# Patient Record
Sex: Female | Born: 1985 | Hispanic: Yes | Marital: Single | Smoking: Never smoker
Health system: Southern US, Community
[De-identification: ages and names within clinical notes are randomized; demographics above are authoritative.]

## PROBLEM LIST (undated history)

## (undated) ENCOUNTER — Inpatient Hospital Stay (HOSPITAL_COMMUNITY): Payer: Self-pay

## (undated) DIAGNOSIS — R112 Nausea with vomiting, unspecified: Secondary | ICD-10-CM

## (undated) DIAGNOSIS — K219 Gastro-esophageal reflux disease without esophagitis: Secondary | ICD-10-CM

## (undated) HISTORY — DX: Gastro-esophageal reflux disease without esophagitis: K21.9

---

## 2004-06-24 ENCOUNTER — Emergency Department (HOSPITAL_COMMUNITY): Admission: EM | Admit: 2004-06-24 | Discharge: 2004-06-25 | Payer: Self-pay | Admitting: Emergency Medicine

## 2004-06-25 ENCOUNTER — Emergency Department (HOSPITAL_COMMUNITY): Admission: EM | Admit: 2004-06-25 | Discharge: 2004-06-26 | Payer: Self-pay | Admitting: Emergency Medicine

## 2004-09-03 ENCOUNTER — Inpatient Hospital Stay (HOSPITAL_COMMUNITY): Admission: AD | Admit: 2004-09-03 | Discharge: 2004-09-03 | Payer: Self-pay | Admitting: Obstetrics & Gynecology

## 2004-09-29 ENCOUNTER — Ambulatory Visit: Payer: Self-pay | Admitting: *Deleted

## 2005-01-08 ENCOUNTER — Inpatient Hospital Stay (HOSPITAL_COMMUNITY): Admission: AD | Admit: 2005-01-08 | Discharge: 2005-01-08 | Payer: Self-pay

## 2005-02-02 ENCOUNTER — Ambulatory Visit: Payer: Self-pay | Admitting: *Deleted

## 2005-02-09 ENCOUNTER — Ambulatory Visit: Payer: Self-pay | Admitting: *Deleted

## 2005-02-23 ENCOUNTER — Ambulatory Visit: Payer: Self-pay | Admitting: *Deleted

## 2005-02-25 ENCOUNTER — Inpatient Hospital Stay (HOSPITAL_COMMUNITY): Admission: AD | Admit: 2005-02-25 | Discharge: 2005-02-27 | Payer: Self-pay | Admitting: *Deleted

## 2005-02-25 ENCOUNTER — Ambulatory Visit: Payer: Self-pay | Admitting: *Deleted

## 2007-05-22 ENCOUNTER — Inpatient Hospital Stay (HOSPITAL_COMMUNITY): Admission: AD | Admit: 2007-05-22 | Discharge: 2007-05-22 | Payer: Self-pay | Admitting: Obstetrics and Gynecology

## 2007-09-11 ENCOUNTER — Inpatient Hospital Stay (HOSPITAL_COMMUNITY): Admission: AD | Admit: 2007-09-11 | Discharge: 2007-09-11 | Payer: Self-pay | Admitting: Gynecology

## 2007-09-11 ENCOUNTER — Ambulatory Visit: Payer: Self-pay | Admitting: Physician Assistant

## 2007-09-12 ENCOUNTER — Ambulatory Visit (HOSPITAL_COMMUNITY): Admission: RE | Admit: 2007-09-12 | Discharge: 2007-09-12 | Payer: Self-pay | Admitting: Obstetrics & Gynecology

## 2007-09-20 ENCOUNTER — Ambulatory Visit: Payer: Self-pay | Admitting: *Deleted

## 2007-10-04 ENCOUNTER — Ambulatory Visit: Payer: Self-pay | Admitting: *Deleted

## 2007-10-18 ENCOUNTER — Ambulatory Visit: Payer: Self-pay | Admitting: Obstetrics & Gynecology

## 2007-11-01 ENCOUNTER — Ambulatory Visit: Payer: Self-pay | Admitting: Obstetrics & Gynecology

## 2007-11-15 ENCOUNTER — Ambulatory Visit: Payer: Self-pay | Admitting: Obstetrics & Gynecology

## 2007-11-29 ENCOUNTER — Ambulatory Visit: Payer: Self-pay | Admitting: Obstetrics & Gynecology

## 2007-12-06 ENCOUNTER — Ambulatory Visit: Payer: Self-pay | Admitting: Obstetrics & Gynecology

## 2007-12-13 ENCOUNTER — Ambulatory Visit: Payer: Self-pay | Admitting: Obstetrics & Gynecology

## 2007-12-20 ENCOUNTER — Ambulatory Visit: Payer: Self-pay | Admitting: Obstetrics & Gynecology

## 2007-12-25 ENCOUNTER — Inpatient Hospital Stay (HOSPITAL_COMMUNITY): Admission: AD | Admit: 2007-12-25 | Discharge: 2007-12-27 | Payer: Self-pay | Admitting: Obstetrics and Gynecology

## 2007-12-25 ENCOUNTER — Inpatient Hospital Stay (HOSPITAL_COMMUNITY): Admission: AD | Admit: 2007-12-25 | Discharge: 2007-12-25 | Payer: Self-pay | Admitting: Obstetrics and Gynecology

## 2007-12-25 ENCOUNTER — Ambulatory Visit: Payer: Self-pay | Admitting: Obstetrics and Gynecology

## 2007-12-27 ENCOUNTER — Encounter: Payer: Self-pay | Admitting: Obstetrics and Gynecology

## 2007-12-27 ENCOUNTER — Ambulatory Visit: Payer: Self-pay | Admitting: Vascular Surgery

## 2010-03-09 ENCOUNTER — Encounter: Admission: RE | Admit: 2010-03-09 | Discharge: 2010-03-09 | Payer: Self-pay | Admitting: Family Medicine

## 2010-09-07 ENCOUNTER — Encounter: Admission: RE | Admit: 2010-09-07 | Discharge: 2010-09-07 | Payer: Self-pay | Admitting: Family Medicine

## 2010-11-08 ENCOUNTER — Encounter: Payer: Self-pay | Admitting: *Deleted

## 2010-12-11 ENCOUNTER — Inpatient Hospital Stay (HOSPITAL_COMMUNITY): Admission: RE | Admit: 2010-12-11 | Payer: Self-pay | Source: Ambulatory Visit

## 2010-12-11 ENCOUNTER — Ambulatory Visit (HOSPITAL_COMMUNITY): Payer: Self-pay

## 2010-12-11 ENCOUNTER — Inpatient Hospital Stay (INDEPENDENT_AMBULATORY_CARE_PROVIDER_SITE_OTHER)
Admission: RE | Admit: 2010-12-11 | Discharge: 2010-12-11 | Disposition: A | Discharge status: Home / Self Care | Payer: Self-pay | Source: Ambulatory Visit

## 2010-12-11 DIAGNOSIS — N6019 Diffuse cystic mastopathy of unspecified breast: Secondary | ICD-10-CM

## 2011-01-09 ENCOUNTER — Emergency Department (HOSPITAL_COMMUNITY)
Admission: EM | Admit: 2011-01-09 | Discharge: 2011-01-09 | Disposition: A | Discharge status: Home / Self Care | Payer: Self-pay | Attending: Emergency Medicine | Admitting: Emergency Medicine

## 2011-01-09 ENCOUNTER — Emergency Department (HOSPITAL_COMMUNITY): Payer: Self-pay

## 2011-01-09 DIAGNOSIS — R110 Nausea: Secondary | ICD-10-CM | POA: Insufficient documentation

## 2011-01-09 DIAGNOSIS — R1013 Epigastric pain: Secondary | ICD-10-CM | POA: Insufficient documentation

## 2011-01-09 DIAGNOSIS — M25519 Pain in unspecified shoulder: Secondary | ICD-10-CM | POA: Insufficient documentation

## 2011-01-09 DIAGNOSIS — K8020 Calculus of gallbladder without cholecystitis without obstruction: Secondary | ICD-10-CM | POA: Insufficient documentation

## 2011-01-09 DIAGNOSIS — R945 Abnormal results of liver function studies: Secondary | ICD-10-CM | POA: Insufficient documentation

## 2011-01-09 LAB — COMPREHENSIVE METABOLIC PANEL
ALT: 70 U/L — ABNORMAL HIGH (ref 0–35)
Albumin: 3.7 g/dL (ref 3.5–5.2)
Alkaline Phosphatase: 69 U/L (ref 39–117)
BUN: 4 mg/dL — ABNORMAL LOW (ref 6–23)
CO2: 19 mEq/L (ref 19–32)
Calcium: 9 mg/dL (ref 8.4–10.5)
Chloride: 112 mEq/L (ref 96–112)
GFR calc Af Amer: >60 mL/min (ref >60)
Sodium: 141 mEq/L (ref 135–145)
Total Bilirubin: 0.8 mg/dL (ref 0.3–1.2)

## 2011-01-09 LAB — LIPASE, BLOOD: Lipase: 36 U/L (ref 11–59)

## 2011-01-09 LAB — DIFFERENTIAL
Basophils Absolute: 0 10*3/uL (ref 0.0–0.1)
Basophils Relative: 0 % (ref 0–1)
Eosinophils Absolute: 0.1 10*3/uL (ref 0.0–0.7)
Eosinophils Relative: 1 % (ref 0–5)
Lymphs Abs: 2.3 10*3/uL (ref 0.7–4.0)
Monocytes Relative: 8 % (ref 3–12)
Neutrophils Relative %: 69 % (ref 43–77)

## 2011-01-09 LAB — CBC
MCH: 29.6 pg (ref 26.0–34.0)
Platelets: 279 10*3/uL (ref 150–400)

## 2011-01-09 LAB — URINE MICROSCOPIC-ADD ON

## 2011-01-09 LAB — URINALYSIS, ROUTINE W REFLEX MICROSCOPIC
Bilirubin Urine: NEGATIVE
Nitrite: NEGATIVE
Specific Gravity, Urine: 1.014 (ref 1.005–1.030)
Urobilinogen, UA: 1 mg/dL (ref 0.0–1.0)

## 2011-01-11 LAB — URINE CULTURE: Colony Count: >=100000

## 2011-07-07 LAB — POCT URINALYSIS DIP (DEVICE)
Bilirubin Urine: NEGATIVE
Glucose, UA: NEGATIVE
Hgb urine dipstick: NEGATIVE
Ketones, ur: NEGATIVE
Nitrite: NEGATIVE
Operator id: 297281
Protein, ur: NEGATIVE
Specific Gravity, Urine: 1.02
Urobilinogen, UA: 0.2
pH: 7

## 2011-07-08 LAB — POCT URINALYSIS DIP (DEVICE)
Bilirubin Urine: NEGATIVE
Glucose, UA: NEGATIVE
Hgb urine dipstick: NEGATIVE
Ketones, ur: NEGATIVE
Nitrite: NEGATIVE
Operator id: 159681
Protein, ur: NEGATIVE
Specific Gravity, Urine: 1.01
Urobilinogen, UA: 0.2
pH: 7

## 2011-07-09 LAB — POCT URINALYSIS DIP (DEVICE)
Bilirubin Urine: NEGATIVE
Bilirubin Urine: NEGATIVE
Glucose, UA: NEGATIVE
Glucose, UA: NEGATIVE
Glucose, UA: NEGATIVE
Hgb urine dipstick: NEGATIVE
Hgb urine dipstick: NEGATIVE
Hgb urine dipstick: NEGATIVE
Ketones, ur: NEGATIVE
Ketones, ur: NEGATIVE
Ketones, ur: NEGATIVE
Nitrite: NEGATIVE
Nitrite: NEGATIVE
Nitrite: NEGATIVE
Operator id: 297281
Operator id: 148111
Operator id: 159681
Protein, ur: 30 — AB
Protein, ur: 30 — AB
Protein, ur: NEGATIVE
Specific Gravity, Urine: 1.02
Specific Gravity, Urine: 1.015
Specific Gravity, Urine: 1.02
Urobilinogen, UA: 0.2
Urobilinogen, UA: 0.2
Urobilinogen, UA: 0.2
pH: 7
pH: 6.5
pH: 7

## 2011-07-12 LAB — POCT URINALYSIS DIP (DEVICE)
Bilirubin Urine: NEGATIVE
Glucose, UA: NEGATIVE
Hgb urine dipstick: NEGATIVE
Ketones, ur: NEGATIVE
Nitrite: NEGATIVE
Operator id: 297281
Protein, ur: NEGATIVE
Specific Gravity, Urine: 1.015
Urobilinogen, UA: 0.2
pH: 6.5

## 2011-07-12 LAB — CBC
HCT: 29 — ABNORMAL LOW
Hemoglobin: 9.8 — ABNORMAL LOW
MCHC: 33.9
MCV: 79.9
Platelets: 301
RBC: 3.62 — ABNORMAL LOW
RDW: 16.3 — ABNORMAL HIGH
WBC: 11.8 — ABNORMAL HIGH

## 2011-07-12 LAB — RPR: RPR Ser Ql: NONREACTIVE

## 2011-07-23 LAB — POCT URINALYSIS DIP (DEVICE)
Bilirubin Urine: NEGATIVE
Bilirubin Urine: NEGATIVE
Glucose, UA: NEGATIVE
Glucose, UA: NEGATIVE
Hgb urine dipstick: NEGATIVE
Hgb urine dipstick: NEGATIVE
Ketones, ur: NEGATIVE
Ketones, ur: NEGATIVE
Nitrite: NEGATIVE
Nitrite: NEGATIVE
Operator id: 120861
Operator id: 297281
Protein, ur: NEGATIVE
Protein, ur: NEGATIVE
Specific Gravity, Urine: 1.015
Specific Gravity, Urine: 1.02
Urobilinogen, UA: 0.2
Urobilinogen, UA: 0.2
pH: 7
pH: 7

## 2011-07-26 LAB — POCT URINALYSIS DIP (DEVICE)
Bilirubin Urine: NEGATIVE
Glucose, UA: NEGATIVE
Hgb urine dipstick: NEGATIVE
Ketones, ur: NEGATIVE
Nitrite: NEGATIVE
Operator id: 297281
Protein, ur: NEGATIVE
Specific Gravity, Urine: 1.015
Urobilinogen, UA: 0.2
pH: 6.5

## 2011-07-27 LAB — URINALYSIS, ROUTINE W REFLEX MICROSCOPIC
Bilirubin Urine: NEGATIVE
Glucose, UA: NEGATIVE
Hgb urine dipstick: NEGATIVE
Ketones, ur: NEGATIVE
Nitrite: NEGATIVE
Protein, ur: NEGATIVE
Specific Gravity, Urine: 1.025
Urobilinogen, UA: 0.2
pH: 6

## 2011-07-27 LAB — RUBELLA SCREEN: Rubella: 127.1 — ABNORMAL HIGH

## 2011-07-27 LAB — GC/CHLAMYDIA PROBE AMP, GENITAL
Chlamydia, DNA Probe: NEGATIVE
GC Probe Amp, Genital: NEGATIVE

## 2011-07-27 LAB — SICKLE CELL SCREEN: Sickle Cell Screen: NEGATIVE

## 2011-07-27 LAB — CBC
HCT: 36.8
Hemoglobin: 12.5
MCHC: 33.9
MCV: 86.4
Platelets: 286
RBC: 4.27
RDW: 14.3
WBC: 11.8 — ABNORMAL HIGH

## 2011-07-27 LAB — RPR: RPR Ser Ql: NONREACTIVE

## 2011-07-27 LAB — DIFFERENTIAL
Basophils Absolute: 0.1
Basophils Relative: 1
Eosinophils Absolute: 0.1 — ABNORMAL LOW
Eosinophils Relative: 1
Lymphocytes Relative: 19
Lymphs Abs: 2.3
Monocytes Absolute: 0.7
Monocytes Relative: 6
Neutro Abs: 8.7 — ABNORMAL HIGH
Neutrophils Relative %: 74

## 2011-07-27 LAB — STREP B DNA PROBE: Strep Group B Ag: NEGATIVE

## 2011-07-27 LAB — WET PREP, GENITAL
Clue Cells Wet Prep HPF POC: NONE SEEN
Trich, Wet Prep: NONE SEEN
Yeast Wet Prep HPF POC: NONE SEEN

## 2011-07-27 LAB — TYPE AND SCREEN
ABO/RH(D): O POS
Antibody Screen: NEGATIVE

## 2011-07-27 LAB — HEPATITIS B SURFACE ANTIGEN: Hepatitis B Surface Ag: NEGATIVE

## 2011-08-02 LAB — URINALYSIS, ROUTINE W REFLEX MICROSCOPIC
Bilirubin Urine: NEGATIVE
Glucose, UA: NEGATIVE
Hgb urine dipstick: NEGATIVE
Ketones, ur: NEGATIVE
Nitrite: NEGATIVE
Protein, ur: NEGATIVE
Specific Gravity, Urine: 1.025
Urobilinogen, UA: 0.2
pH: 6

## 2011-08-02 LAB — CBC
HCT: 37.1
MCV: 85.4
RBC: 4.34
WBC: 9

## 2011-08-02 LAB — ABO/RH: ABO/RH(D): O POS

## 2011-08-02 LAB — URINE MICROSCOPIC-ADD ON

## 2011-08-02 LAB — POCT PREGNANCY, URINE: Operator id: 23932

## 2011-08-02 LAB — HCG, QUANTITATIVE, PREGNANCY: hCG, Beta Chain, Quant, S: 97691 — ABNORMAL HIGH

## 2011-10-20 DIAGNOSIS — R112 Nausea with vomiting, unspecified: Secondary | ICD-10-CM

## 2011-10-20 HISTORY — DX: Nausea with vomiting, unspecified: R11.2

## 2012-03-20 ENCOUNTER — Other Ambulatory Visit: Payer: Self-pay | Admitting: Family Medicine

## 2012-03-20 DIAGNOSIS — N63 Unspecified lump in unspecified breast: Secondary | ICD-10-CM

## 2012-04-03 ENCOUNTER — Inpatient Hospital Stay: Admission: RE | Admit: 2012-04-03 | Payer: Self-pay | Source: Ambulatory Visit

## 2012-04-17 ENCOUNTER — Ambulatory Visit
Admission: RE | Admit: 2012-04-17 | Discharge: 2012-04-17 | Disposition: A | Discharge status: Home / Self Care | Payer: Self-pay | Source: Ambulatory Visit | Attending: Family Medicine | Admitting: Family Medicine

## 2012-04-17 DIAGNOSIS — N63 Unspecified lump in unspecified breast: Secondary | ICD-10-CM

## 2012-05-03 ENCOUNTER — Ambulatory Visit (INDEPENDENT_AMBULATORY_CARE_PROVIDER_SITE_OTHER): Payer: Self-pay | Admitting: Family Medicine

## 2012-05-03 ENCOUNTER — Encounter: Payer: Self-pay | Admitting: Family Medicine

## 2012-05-03 VITALS — BP 124/75 | HR 88 | Ht 61.0 in | Wt 206.0 lb

## 2012-05-03 DIAGNOSIS — R1013 Epigastric pain: Secondary | ICD-10-CM | POA: Insufficient documentation

## 2012-05-03 DIAGNOSIS — R350 Frequency of micturition: Secondary | ICD-10-CM

## 2012-05-03 DIAGNOSIS — Z8719 Personal history of other diseases of the digestive system: Secondary | ICD-10-CM

## 2012-05-03 DIAGNOSIS — E669 Obesity, unspecified: Secondary | ICD-10-CM | POA: Insufficient documentation

## 2012-05-03 HISTORY — DX: Obesity, unspecified: E66.9

## 2012-05-03 HISTORY — DX: Personal history of other diseases of the digestive system: Z87.19

## 2012-05-03 LAB — POCT GLYCOSYLATED HEMOGLOBIN (HGB A1C): Hemoglobin A1C: 5.1

## 2012-05-03 MED ORDER — OMEPRAZOLE 20 MG PO CPDR: 20.0000 mg | DELAYED_RELEASE_CAPSULE | Freq: Every day | ORAL | Status: DC

## 2012-05-03 NOTE — Progress Notes (Signed)
  Subjective:    Patient ID: Rhonda Waters, female    DOB: 03-15-1986, 26 y.o.   MRN: [id number]  HPI     Pt comes today to establish care in our [facility]. Visit was conducted in [place]. She presents with two complaints today: epigastric pain an urinary frequency. 1. Epigastric pain: episodic, first noticed on 2005, then repeated in 2011 and the last one was present for 8 days last week with exacerbation from Saturday to Sunday(04/30/2012). Hx:  Pt has history of cholelithiasis diagnosed by U/S in 12/2010 but never follow up with Surgery for evaluation. She also has history of GERD for several years and recently she noticed has gotten worse to the point that she avoids eating  after 6:00pm to prevent  acid reflux at night when pt is going to sleep.  The epigastric pain it is noticed to happen with empty stomach or after eating. It happens, as I mention before, in episodes and lasts for hours to days. It is described as sharp and intensity 6/10. Pt at this time does not have abdominal pain. No nausea, vomiting or changes on BM are reported. No dark stools or appreciable blood seen. It resolves on its own. Pt denies the use of NSAID's, aspirin containing meds or  OTC medications/remedies. Pt does is a non smoker, denies use of recreational drugs, alcohol or coffee. Pain is reported to worsen with ingestion of sodas and alleviates on its own.  2. Urinary frequency: reported to be present for about two years. It happens most often at night when she has to wake up several times to void. Pt denies urinary urgency, incontinence or burning with urination. No fevers, flank pain or diagnosed UTI's during this period of time. No nausea, vomiting. No dyspareunia or masses felt in vagina, no discharge. No pelvic pain. No history of STD.   Review of Systems  Constitutional: Negative for chills, activity change, appetite change and unexpected weight change.  Rest per HPI     Objective:   Physical Exam    Constitutional: She is oriented to person, place, and time. No distress.  HENT:  Mouth/Throat: Oropharynx is clear and moist. No oropharyngeal exudate.  Eyes: Conjunctivae are normal. Pupils are equal, round, and reactive to light.  Neck: Normal range of motion. Neck supple. No thyromegaly present.  Cardiovascular: Normal rate, regular rhythm and normal heart sounds.   Pulmonary/Chest: Effort normal and breath sounds normal.  Abdominal: Soft. Bowel sounds are normal. She exhibits no mass. There is no rebound and no guarding.       Mildly tender to palpation of epigastrium. No CVA tenderness.   Genitourinary:       GYN: Declined at this visit.  Musculoskeletal: She exhibits no edema.  Neurological: She is alert and oriented to person, place, and time.       No neurologic focalization.      Assessment & Plan:

## 2012-05-03 NOTE — Assessment & Plan Note (Signed)
Hx of GERD, with symptoms described most likely gastritis. Other diagnosis in the differential peptic-duodenal ulcer, H pylori infection, malignancy. Pt is non smoker, no melena, no unintentional weight loss makes less likely malignancy.  Plan: Will treat with PPI for 4-6 weeks. Will obtain occult fecal blood X3. if no improvement, fecal blood positive or worsening condition will consider referral to GI for endoscopy and respective H.Pylory testing. Pt with financial limitations is applying for [employer].

## 2012-05-03 NOTE — Assessment & Plan Note (Signed)
Nocturia present for 2 years favors a chronic condition. No symptoms of dysuria, flank pain, fever. Vaginal exam declined in this visit. Pt obese and borderline Glucose. No polydipsia. U/S 12/2010 with no anatomic abnormalities, no masses. Pt obese the possibility of OSA is not excluded (Nocturia is seen in 50 % of OSA ) Overreactive bladder less likely since pt is not incontinent (stress or urge) and increase urinary frequency is not perceived to happen during the day. Plan: We recommended pap smear and at that point will evaluate for cystocele or other mechanical obstructions. A1C today to evaluate for possible underlining DM. Pt with financial limitations declines urinalysis, electrolytes and  kidney function testing at this visit. Will re-assess this problem next visit.  Sleep hygiene discussed.

## 2012-05-03 NOTE — Patient Instructions (Addendum)
Ha sido un placer conocerla hoy. El medicamento que le recomiendo tomar es Omeprazole 20 mg y lo debe tomar diario por 6 semanas.  Tambien es importante que se tome las muestras de heces  fecales y nos las Armed forces technical officer. Si tuviese mas dolor o cambio de Careers information officer de las deposiciones necesita ser evaluada nuevamente. Se le va a hacer un analisis hoy para ver si tiene problemas con Neurosurgeon. Si el analisis da alterado yo la llamo personalmente, sino pues lo conversamos en la visita siguiente. Haga una cita en 4-6 semanas para saber como sigue. Haga tambien una cita para la prueba del Fate.

## 2012-06-14 ENCOUNTER — Encounter: Payer: Self-pay | Admitting: Family Medicine

## 2012-06-14 ENCOUNTER — Ambulatory Visit (INDEPENDENT_AMBULATORY_CARE_PROVIDER_SITE_OTHER): Payer: Self-pay | Admitting: Family Medicine

## 2012-06-14 VITALS — BP 112/70 | HR 67 | Temp 97.9°F | Resp 20 | Wt 199.0 lb

## 2012-06-14 DIAGNOSIS — Z8719 Personal history of other diseases of the digestive system: Secondary | ICD-10-CM

## 2012-06-14 DIAGNOSIS — E669 Obesity, unspecified: Secondary | ICD-10-CM

## 2012-06-14 DIAGNOSIS — R350 Frequency of micturition: Secondary | ICD-10-CM

## 2012-06-14 DIAGNOSIS — K089 Disorder of teeth and supporting structures, unspecified: Secondary | ICD-10-CM

## 2012-06-14 DIAGNOSIS — G8929 Other chronic pain: Secondary | ICD-10-CM

## 2012-06-14 DIAGNOSIS — K219 Gastro-esophageal reflux disease without esophagitis: Secondary | ICD-10-CM

## 2012-06-14 MED ORDER — OMEPRAZOLE 20 MG PO CPDR: 20.0000 mg | DELAYED_RELEASE_CAPSULE | Freq: Every day | ORAL | Status: DC

## 2012-06-14 NOTE — Assessment & Plan Note (Addendum)
BMI improving. Intentional weight loss of 8 lb in 5 weeks. Very reassuring. Pt very motivated. A1C 5.1 Plan: Positive reinforcement during the visit. Continue diet, exercise 30 min 3 times a weeks also recommended.

## 2012-06-14 NOTE — Assessment & Plan Note (Addendum)
Still with symptoms but pt has not been compliant with treatment indicated. Plan: Omeprazole sent to pharmacy. Since pt states that her brother lost her paper prescription that was given at last visit. Fecal occult testing important to evaluate complications and/or need for scoping.

## 2012-06-14 NOTE — Patient Instructions (Addendum)
Reflujo gastroesofgico - Adultos  (Gastroesophageal Reflux Disease, Adult)  El reflujo gastroesofgico ocurre cuando el cido del estmago pasa al esfago. Cuando el cido entra en contacto con el esfago, el cido provoca dolor (inflamacin) en el esfago. Con el tiempo, pueden formarse pequeos agujeros (lceras) en el revestimiento del esfago. CAUSAS   Exceso de peso corporal. Esto aplica presin sobre el estmago, lo que hace que el cido del estmago suba hacia el esfago.   El hbito de fumar Aumenta la produccin de cido en el estmago.   El consumo de alcohol. Provoca disminucin de la presin en el esfnter esofgico inferior (vlvula o anillo de msculo entre el esfago y el estmago), permitiendo que el cido del estmago suba hacia el esfago.   Cenas a ltima hora del da y estmago lleno. Aumenta la presin y la produccin de cido en el estmago.   Malformacin en el esfnter esofgico inferior.  A menudo no se halla causa.  SNTOMAS   Ardor y dolor en la parte inferior del pecho detrs del esternn y en la zona media del estmago. Puede ocurrir dos veces por semana o ms a menudo.   Dificultad para tragar.   Dolor de garganta.   Tos seca.   Sntomas similares al asma que incluyen sensacin de opresin en el pecho, falta de aire y sibilancias.  DIAGNSTICO  El mdico diagnosticar el problema basndose en los sntomas. En algunos casos, se indican radiografas y otras pruebas para verificar si hay complicaciones o para comprobar el estado del estmago y el esfago.  TRATAMIENTO  El mdico le indicar medicamentos de venta libre o recetados para ayudar a disminuir la produccin de cido. Consulte con su mdico antes de empezar o agregar cualquier medicamento nuevo.  INSTRUCCIONES PARA EL CUIDADO EN EL HOGAR   Modifique los factores que pueda cambiar. Consulte con su mdico para solicitar orientacin relacionada con la prdida de peso, dejar de fumar y el consumo de  alcohol.   Evite las comidas y bebidas que empeoran los problemas, como:   Bebidas con cafena o alcohlicas.   Chocolate.   Sabores a menta.   Ajo y cebolla.   Comidas muy condimentadas.   Ctricos como naranjas, limones o limas.   Alimentos que contengan tomate, como salsas, chile y pizza.   Alimentos fritos y grasos.   Evite acostarse durante 3 horas antes de irse a dormir o antes de tomar una siesta.   Haga comidas pequeas durante el da en lugar de 3 comidas abundantes.   Use ropas sueltas. No use nada apretado alrededor de la cintura que cause presin en el estmago.   Levante (eleve) la cabecera de la cama 6 a 8 pulgadas (15 a 20 cm) con bloques de madera. Usar almohadas extra no ayuda.   Solo tome medicamentos que se pueden comprar sin receta o recetados para el dolor, malestar o fiebre, como le indica el mdico.   No tome aspirina, ibuprofeno ni antiinflamatorios no esteroides.  SOLICITE ATENCIN MDICA DE INMEDIATO SI:   Siente dolor en los brazos, el cuello, la mandbula, los dientes o la espalda.   El dolor aumenta o cambia la intensidad o la durancin.   Tiene nuseas, vmitos o sudoracin(diaforesis).   Siente falta de aire o dolor en el pecho, o se desmaya.   Vomita y el vmito tiene sangre, es de color verde, amarillo, negro o es similar a la borra del caf o tiene sangre.   Las heces son rojas, sanguinolentas o   negras.  Estos sntomas pueden ser signos de otros problemas, como enfermedades cardacas, hemorragias gstrias o sangrado esofgico.  ASEGRESE DE QUE:   Comprende estas instrucciones.   Controlar su enfermedad.   Solicitar ayuda de inmediato si no mejora o si empeora.  Document Released: 07/14/2005 Document Revised: 09/23/2011 [employer] Patient Information 2012 [employer]. 

## 2012-06-14 NOTE — Progress Notes (Signed)
  Subjective:    Patient ID: Rhonda Waters, female    DOB: [birth year], 26 y.o.   MRN: [id number]  HPI Pt comes today for followup 1. GERD: She never took the medication prescribed. She still has symptoms of acid reflux that worsen with certain foods. She also did not obtain the fecal ocult test ordered. Denies others symptoms like dark stools, abdominal pain or changes on her BM habits. 2. Urinary problems: Denies symptoms of dysuria or frequency at this time. 3. Obesity: interested in losing weight. Concerned about this condition as a risk factor of other comorbidities. Has loss weight intentionally with diet modification. She keeps herself active but no on a exercise regimen per se. 4. Dental pain: Dental pain is mostly on her upper right second molar and intermittent. She also reports pain on one of the frontal tooth. No current symptoms of acute infection. Denies pain today, edema, drainage or adenopathies. No fever. She would like a referral to odontology in order of take care of this electively.  Health Maintenance: Declines pap smear today. Review of Systems Per HPI    Objective:   Physical Exam Gen:  NAD HEENT: Moist mucous membranes  CV: Regular rate and rhythm, no murmurs PULM: Clear to auscultation bilaterally.  ABD: Soft, non tender, normal bowel sounds. Not CVA tenderness EXT: No edema Neuro: Alert and oriented x3. No focalization     Assessment & Plan:

## 2012-10-19 ENCOUNTER — Encounter (HOSPITAL_COMMUNITY): Payer: Self-pay

## 2012-10-19 ENCOUNTER — Observation Stay (HOSPITAL_COMMUNITY)
Admission: EM | Admit: 2012-10-19 | Discharge: 2012-10-21 | Disposition: A | Discharge status: Home / Self Care | Payer: Medicaid Other | Attending: Surgery | Admitting: Surgery

## 2012-10-19 ENCOUNTER — Emergency Department (HOSPITAL_COMMUNITY): Payer: Medicaid Other

## 2012-10-19 DIAGNOSIS — K8020 Calculus of gallbladder without cholecystitis without obstruction: Secondary | ICD-10-CM

## 2012-10-19 DIAGNOSIS — K8010 Calculus of gallbladder with chronic cholecystitis without obstruction: Principal | ICD-10-CM | POA: Insufficient documentation

## 2012-10-19 DIAGNOSIS — R109 Unspecified abdominal pain: Secondary | ICD-10-CM

## 2012-10-19 DIAGNOSIS — K219 Gastro-esophageal reflux disease without esophagitis: Secondary | ICD-10-CM | POA: Insufficient documentation

## 2012-10-19 HISTORY — DX: Nausea with vomiting, unspecified: R11.2

## 2012-10-19 LAB — URINE MICROSCOPIC-ADD ON

## 2012-10-19 LAB — URINALYSIS, ROUTINE W REFLEX MICROSCOPIC
Glucose, UA: NEGATIVE mg/dL
Hgb urine dipstick: NEGATIVE
Ketones, ur: NEGATIVE mg/dL
Nitrite: NEGATIVE
Protein, ur: NEGATIVE mg/dL
Urobilinogen, UA: 0.2 mg/dL (ref 0.0–1.0)

## 2012-10-19 LAB — CBC WITH DIFFERENTIAL/PLATELET
Basophils Relative: 0 % (ref 0–1)
Eosinophils Absolute: 0.1 10*3/uL (ref 0.0–0.7)
Eosinophils Relative: 0 % (ref 0–5)
HCT: 34.8 % — ABNORMAL LOW (ref 36.0–46.0)
Hemoglobin: 11.6 g/dL — ABNORMAL LOW (ref 12.0–15.0)
Lymphocytes Relative: 17 % (ref 12–46)
Lymphs Abs: 2.6 10*3/uL (ref 0.7–4.0)
MCH: 28.7 pg (ref 26.0–34.0)
MCV: 86.1 fL (ref 78.0–100.0)
Monocytes Absolute: 0.5 10*3/uL (ref 0.1–1.0)
Neutro Abs: 11.5 10*3/uL — ABNORMAL HIGH (ref 1.7–7.7)
RDW: 13.4 % (ref 11.5–15.5)

## 2012-10-19 LAB — COMPREHENSIVE METABOLIC PANEL
AST: 13 U/L (ref 0–37)
Chloride: 103 mEq/L (ref 96–112)
GFR calc Af Amer: >90 mL/min (ref >90)
Potassium: 3.8 mEq/L (ref 3.5–5.1)
Total Protein: 7.5 g/dL (ref 6.0–8.3)

## 2012-10-19 LAB — LIPASE, BLOOD: Lipase: 14 U/L (ref 11–59)

## 2012-10-19 MED ORDER — HYDROMORPHONE HCL PF 1 MG/ML IJ SOLN
1.0000 mg | Freq: Once | INTRAMUSCULAR | Status: AC
  Administered 2012-10-19: 1 mg via INTRAVENOUS
  Filled 2012-10-19: qty 1

## 2012-10-19 MED ORDER — INFLUENZA VIRUS VACC SPLIT PF IM SUSP: 0.5000 mL | INTRAMUSCULAR | Status: AC

## 2012-10-19 MED ORDER — DOCUSATE SODIUM 100 MG PO CAPS
100.0000 mg | ORAL_CAPSULE | Freq: Two times a day (BID) | ORAL | Status: DC
  Administered 2012-10-19: 100 mg via ORAL

## 2012-10-19 MED ORDER — POLYETHYLENE GLYCOL 3350 17 G PO PACK
17.0000 g | PACK | Freq: Every day | ORAL | Status: DC | PRN
  Filled 2012-10-19: qty 1

## 2012-10-19 MED ORDER — ONDANSETRON HCL 4 MG/2ML IJ SOLN
4.0000 mg | Freq: Once | INTRAMUSCULAR | Status: AC
  Administered 2012-10-19: 4 mg via INTRAVENOUS
  Filled 2012-10-19: qty 2

## 2012-10-19 MED ORDER — ONDANSETRON 4 MG PO TBDP
8.0000 mg | ORAL_TABLET | Freq: Once | ORAL | Status: AC
  Administered 2012-10-19: 8 mg via ORAL
  Filled 2012-10-19 (×2): qty 1

## 2012-10-19 MED ORDER — ONDANSETRON HCL 4 MG/2ML IJ SOLN: 4.0000 mg | Freq: Four times a day (QID) | INTRAMUSCULAR | Status: DC | PRN

## 2012-10-19 MED ORDER — POTASSIUM CHLORIDE IN NACL 20-0.9 MEQ/L-% IV SOLN
INTRAVENOUS | Status: DC
  Administered 2012-10-19 – 2012-10-20 (×2): via INTRAVENOUS
  Filled 2012-10-19 (×4): qty 1000

## 2012-10-19 MED ORDER — HYDROMORPHONE HCL PF 1 MG/ML IJ SOLN
0.5000 mg | Freq: Once | INTRAMUSCULAR | Status: AC
  Administered 2012-10-19: 0.5 mg via INTRAVENOUS

## 2012-10-19 MED ORDER — DIPHENHYDRAMINE HCL 50 MG/ML IJ SOLN: 12.5000 mg | Freq: Four times a day (QID) | INTRAMUSCULAR | Status: DC | PRN

## 2012-10-19 MED ORDER — GI COCKTAIL ~~LOC~~
30.0000 mL | Freq: Once | ORAL | Status: AC
  Administered 2012-10-19: 30 mL via ORAL
  Filled 2012-10-19: qty 30

## 2012-10-19 MED ORDER — DIPHENHYDRAMINE HCL 12.5 MG/5ML PO ELIX
12.5000 mg | ORAL_SOLUTION | Freq: Four times a day (QID) | ORAL | Status: DC | PRN
  Filled 2012-10-19: qty 10

## 2012-10-19 MED ORDER — INFLUENZA VIRUS VACC SPLIT PF IM SUSP: 0.5000 mL | INTRAMUSCULAR | Status: DC

## 2012-10-19 MED ORDER — SODIUM CHLORIDE 0.9 % IV SOLN: INTRAVENOUS | Status: DC

## 2012-10-19 MED ORDER — MORPHINE SULFATE 2 MG/ML IJ SOLN
1.0000 mg | INTRAMUSCULAR | Status: DC | PRN
  Administered 2012-10-19: 2 mg via INTRAVENOUS
  Administered 2012-10-19: 4 mg via INTRAVENOUS
  Administered 2012-10-19 (×2): 2 mg via INTRAVENOUS
  Administered 2012-10-20: 3 mg via INTRAVENOUS
  Filled 2012-10-19: qty 2
  Filled 2012-10-19: qty 1
  Filled 2012-10-19: qty 2
  Filled 2012-10-19 (×2): qty 1

## 2012-10-19 MED ORDER — SODIUM CHLORIDE 0.9 % IV BOLUS (SEPSIS)
500.0000 mL | Freq: Once | INTRAVENOUS | Status: AC
  Administered 2012-10-19: 1000 mL via INTRAVENOUS

## 2012-10-19 MED ORDER — HYDROCODONE-ACETAMINOPHEN 5-325 MG PO TABS: 1.0000 | ORAL_TABLET | ORAL | Status: DC | PRN

## 2012-10-19 NOTE — ED Notes (Signed)
Pt presents with onset of epigastric pain that woke her from sleep at 0300 this morning.  +nausea and vomiting, pt denies any diarrhea.

## 2012-10-19 NOTE — H&P (Signed)
Rhonda Waters is an 27 y.o. female.   Chief Complaint: RUQ and epigastric pain HPI:  27 y/o female presents to [facility] with severe onset of colicky epigastric/RUQ pain that woke her from sleep at 0300 this morning. She thought it was heartburn, but it worsened in severity which bought her to the hospital.  She also c/o nausea and vomiting, but denies diarrhea, fevers/chills. Pt has had 3 similar episodes in the past in 2005, 2006, and 2012.  She was told that she had a "large ball in her intestines blocking it".  She has had 3 children most recent in 2009.     Past Medical History  Diagnosis Date  . GERD (gastroesophageal reflux disease)     History reviewed. No pertinent past surgical history.  No family history on file. Social History:  reports that she has never smoked. She has never used smokeless tobacco. She reports that she does not drink alcohol or use illicit drugs.  Allergies: No Known Allergies   (Not in a hospital admission)  Results for orders placed during the hospital encounter of 10/19/12 (from the past 48 hour(s))  URINALYSIS, ROUTINE W REFLEX MICROSCOPIC     Status: Abnormal   Collection Time   10/19/12 11:31 AM      Component Value Range Comment   Color, Urine YELLOW  YELLOW    APPearance CLOUDY (*) CLEAR    Specific Gravity, Urine 1.037 (*) 1.005 - 1.030    pH 8.0  5.0 - 8.0    Glucose, UA NEGATIVE  NEGATIVE mg/dL    Hgb urine dipstick NEGATIVE  NEGATIVE    Bilirubin Urine NEGATIVE  NEGATIVE    Ketones, ur NEGATIVE  NEGATIVE mg/dL    Protein, ur NEGATIVE  NEGATIVE mg/dL    Urobilinogen, UA 0.2  0.0 - 1.0 mg/dL    Nitrite NEGATIVE  NEGATIVE    Leukocytes, UA TRACE (*) NEGATIVE   PREGNANCY, URINE     Status: Normal   Collection Time   10/19/12 11:31 AM      Component Value Range Comment   Preg Test, Ur NEGATIVE  NEGATIVE   URINE MICROSCOPIC-ADD ON     Status: Abnormal   Collection Time   10/19/12 11:31 AM      Component Value Range Comment   Squamous  Epithelial / LPF MANY (*) RARE    WBC, UA 0-2  <3 WBC/hpf    RBC / HPF 0-2  <3 RBC/hpf    Bacteria, UA FEW (*) RARE    Urine-Other MUCOUS PRESENT     CBC WITH DIFFERENTIAL     Status: Abnormal   Collection Time   10/19/12 11:47 AM      Component Value Range Comment   WBC 14.7 (*) 4.0 - 10.5 K/uL    RBC 4.04  3.87 - 5.11 MIL/uL    Hemoglobin 11.6 (*) 12.0 - 15.0 g/dL    HCT 16.1 (*) 09.6 - 46.0 %    MCV 86.1  78.0 - 100.0 fL    MCH 28.7  26.0 - 34.0 pg    MCHC 33.3  30.0 - 36.0 g/dL    RDW 04.5  40.9 - 81.1 %    Platelets 301  150 - 400 K/uL    Neutrophils Relative 79 (*) 43 - 77 %    Neutro Abs 11.5 (*) 1.7 - 7.7 K/uL    Lymphocytes Relative 17  12 - 46 %    Lymphs Abs 2.6  0.7 - 4.0 K/uL  Monocytes Relative 4  3 - 12 %    Monocytes Absolute 0.5  0.1 - 1.0 K/uL    Eosinophils Relative 0  0 - 5 %    Eosinophils Absolute 0.1  0.0 - 0.7 K/uL    Basophils Relative 0  0 - 1 %    Basophils Absolute 0.0  0.0 - 0.1 K/uL   COMPREHENSIVE METABOLIC PANEL     Status: Abnormal   Collection Time   10/19/12 11:47 AM      Component Value Range Comment   Sodium 137  135 - 145 mEq/L    Potassium 3.8  3.5 - 5.1 mEq/L    Chloride 103  96 - 112 mEq/L    CO2 23  19 - 32 mEq/L    Glucose, Bld 97  70 - 99 mg/dL    BUN 11  6 - 23 mg/dL    Creatinine, Ser 4.54  0.50 - 1.10 mg/dL    Calcium 9.3  8.4 - 09.8 mg/dL    Total Protein 7.5  6.0 - 8.3 g/dL    Albumin 3.5  3.5 - 5.2 g/dL    AST 13  0 - 37 U/L    ALT 14  0 - 35 U/L    Alkaline Phosphatase 63  39 - 117 U/L    Total Bilirubin 0.2 (*) 0.3 - 1.2 mg/dL    GFR calc non Af Amer >90  >90 mL/min    GFR calc Af Amer >90  >90 mL/min   LIPASE, BLOOD     Status: Normal   Collection Time   10/19/12 11:47 AM      Component Value Range Comment   Lipase 14  11 - 59 U/L    US Abdomen Complete  10/19/2012  *RADIOLOGY REPORT*  Clinical Data:  Abdominal pain  COMPLETE ABDOMINAL ULTRASOUND  Comparison:  None.  Findings:  Gallbladder:  Multiple gallstones.   No gallbladder wall thickening or pericholecystic fluid.  Negative sonographic Murphy's sign.  Common bile duct:  Measures 4 mm.  Liver:  No focal lesion identified.  Within normal limits in parenchymal echogenicity.  IVC:  Appears normal.  Pancreas:  Incompletely visualized but grossly unremarkable.  Spleen:  Measures 6.1 cm.  Right Kidney:  Measures 11.7 cm.  No mass or hydronephrosis.  Left Kidney:  Measures 12.1 cm.  No mass or hydronephrosis.  Abdominal aorta:  No aneurysm identified.  IMPRESSION: Cholelithiasis, without associated findings to suggest acute cholecystitis.   Original Report Authenticated By: Charline Bills, M.D.     Review of Systems  Constitutional: Negative for fever and chills.  Respiratory: Negative for shortness of breath.   Cardiovascular: Negative for chest pain.  Gastrointestinal: Positive for heartburn, nausea, vomiting and abdominal pain (RUQ and epigastric). Negative for constipation.  Genitourinary: Negative for dysuria.  Musculoskeletal: Negative for myalgias.  Neurological: Positive for weakness.    Blood pressure 96/52, pulse 70, temperature 98.2 F (36.8 C), temperature source Oral, resp. rate 16, last menstrual period 09/28/2012, SpO2 98.00%. Physical Exam  Constitutional: She is oriented to person, place, and time. She appears well-developed and well-nourished.  HENT:  Head: Normocephalic and atraumatic.  Eyes: Conjunctivae normal are normal. Pupils are equal, round, and reactive to light. Right eye exhibits no discharge.  Neck: Normal range of motion.  Cardiovascular: Normal rate and regular rhythm.  Exam reveals no gallop and no friction rub.   No murmur heard. Respiratory: Effort normal and breath sounds normal. No respiratory distress. She has no  wheezes. She has no rales. She exhibits no tenderness.  GI: Soft. Bowel sounds are normal. She exhibits no distension and no mass. There is no hepatosplenomegaly. There is tenderness (RUQ and epigastric)  in the right upper quadrant and epigastric area. There is no rebound and no guarding.  Neurological: She is alert and oriented to person, place, and time.  Skin: Skin is warm and dry.  Psychiatric: She has a normal mood and affect. Her behavior is normal.     Assessment/Plan 27 y/o female with recurrent cholelithiasis and suspected cholecystitis 1.  Admit to [facility], clears now, NPO after midnight, IVF, pain control, antiemetics 2.  Schedule for surgery tomorrow for anticipated Lap chole with IOC 3.  Explained procedure to patient using the translator line.  Pt is agreeable to surgery. 4.  Ambulate as tolerated  DORT, Julyanna Scholle 10/19/2012, 2:45 PM

## 2012-10-19 NOTE — H&P (Signed)
General Surgery [facility]  Patient seen and examined in the ER.  Patient's husband and children at bedside, and they speak English and were able to translate for the patient.  Symptomatic cholelithiasis with at least four previous episodes.  Pain now unrelenting with WBC 14K.  Plan to admit and proceed to lap chole with IOC in the morning.  The risks and benefits of the procedure have been discussed at length with the patient.  The patient understands the proposed procedure, potential alternative treatments, and the course of recovery to be expected.  All of the patient's questions have been answered at this time.  The patient wishes to proceed with surgery.  Velora Heckler, MD, [facility] Office: [phone]

## 2012-10-20 ENCOUNTER — Inpatient Hospital Stay (HOSPITAL_COMMUNITY): Payer: Medicaid Other | Admitting: Certified Registered"

## 2012-10-20 ENCOUNTER — Encounter (HOSPITAL_COMMUNITY): Admission: EM | Disposition: A | Discharge status: Home / Self Care | Payer: Self-pay | Source: Home / Self Care

## 2012-10-20 ENCOUNTER — Encounter (HOSPITAL_COMMUNITY): Payer: Self-pay | Admitting: Certified Registered"

## 2012-10-20 ENCOUNTER — Inpatient Hospital Stay (HOSPITAL_COMMUNITY): Payer: Medicaid Other

## 2012-10-20 DIAGNOSIS — K8010 Calculus of gallbladder with chronic cholecystitis without obstruction: Secondary | ICD-10-CM | POA: Diagnosis present

## 2012-10-20 HISTORY — PX: CHOLECYSTECTOMY: SHX55

## 2012-10-20 LAB — COMPREHENSIVE METABOLIC PANEL
ALT: 11 U/L (ref 0–35)
AST: 11 U/L (ref 0–37)
Alkaline Phosphatase: 55 U/L (ref 39–117)
CO2: 24 mEq/L (ref 19–32)
Chloride: 103 mEq/L (ref 96–112)
GFR calc non Af Amer: >90 mL/min (ref >90)
Potassium: 3.7 mEq/L (ref 3.5–5.1)
Sodium: 138 mEq/L (ref 135–145)
Total Bilirubin: 0.4 mg/dL (ref 0.3–1.2)

## 2012-10-20 LAB — CBC
MCH: 29 pg (ref 26.0–34.0)
MCHC: 33.8 g/dL (ref 30.0–36.0)
MCV: 86 fL (ref 78.0–100.0)
Platelets: 257 10*3/uL (ref 150–400)

## 2012-10-20 LAB — URINE CULTURE: Colony Count: 15000

## 2012-10-20 LAB — SURGICAL PCR SCREEN: MRSA, PCR: NEGATIVE

## 2012-10-20 IMAGING — RF DG CHOLANGIOGRAM OPERATIVE
1 series · 5 of 5 positions shown · non-contrast
Comparison: Ultrasound dated [DATE]

CLINICAL DATA: Cholecystectomy for cholelithiasis.

INTRAOPERATIVE CHOLANGIOGRAM
TECHNIQUE: Cholangiographic images from the C-arm fluoroscopic
device were submitted for interpretation post-operatively.  Please
see the procedural report for the amount of contrast and the
fluoroscopy time utilized.

[Series 1: run · 2 acquisitions, 5 frames shown]
[im 1/2]
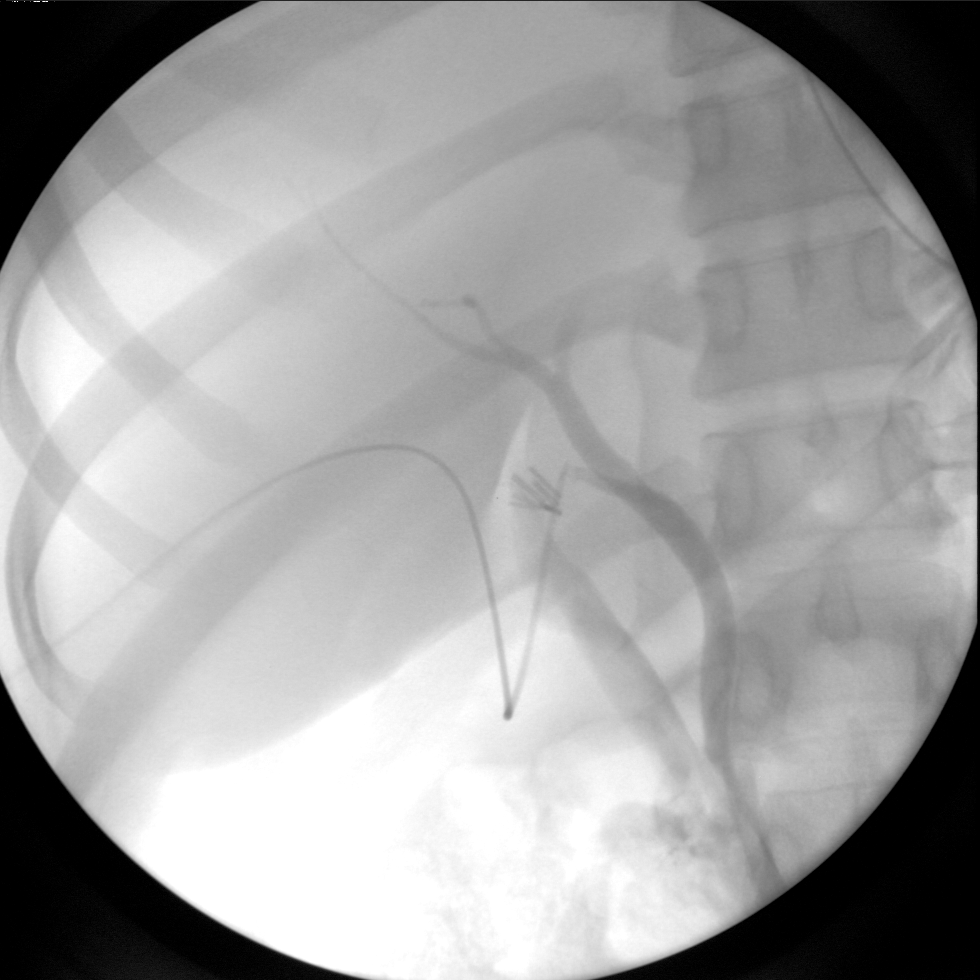
[im 1/2]
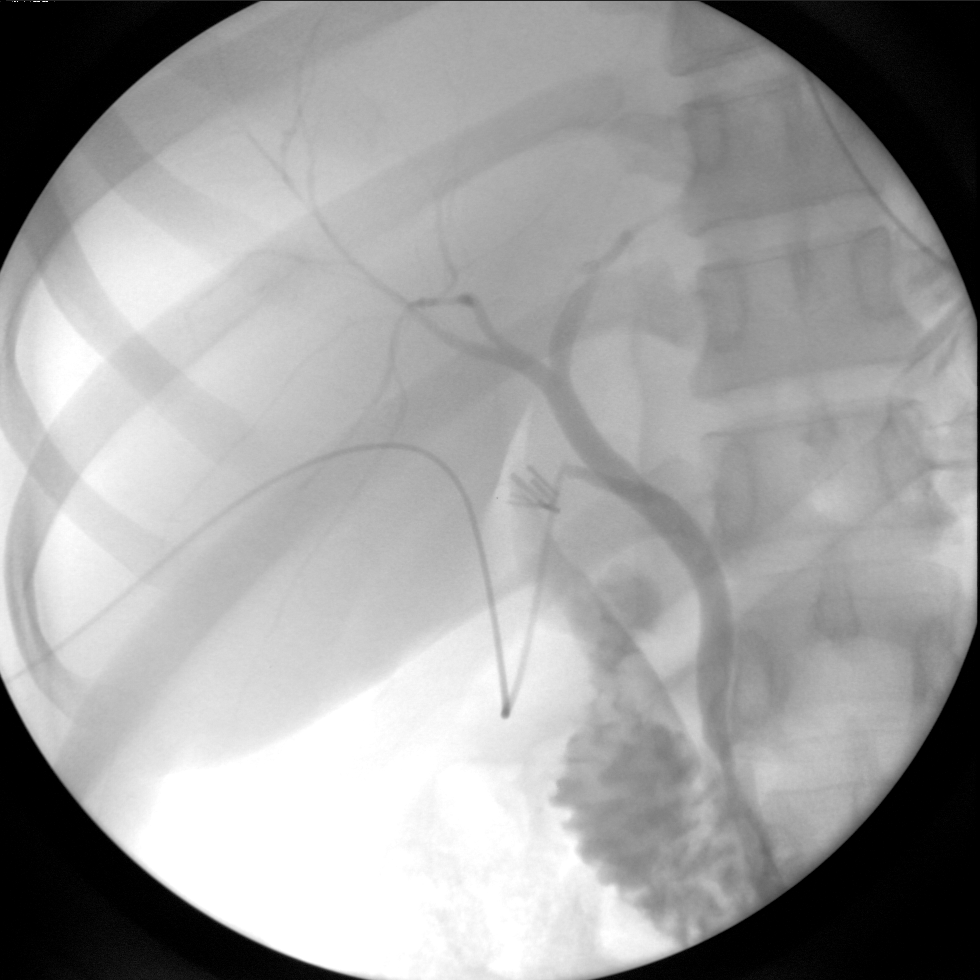
[im 1/2]
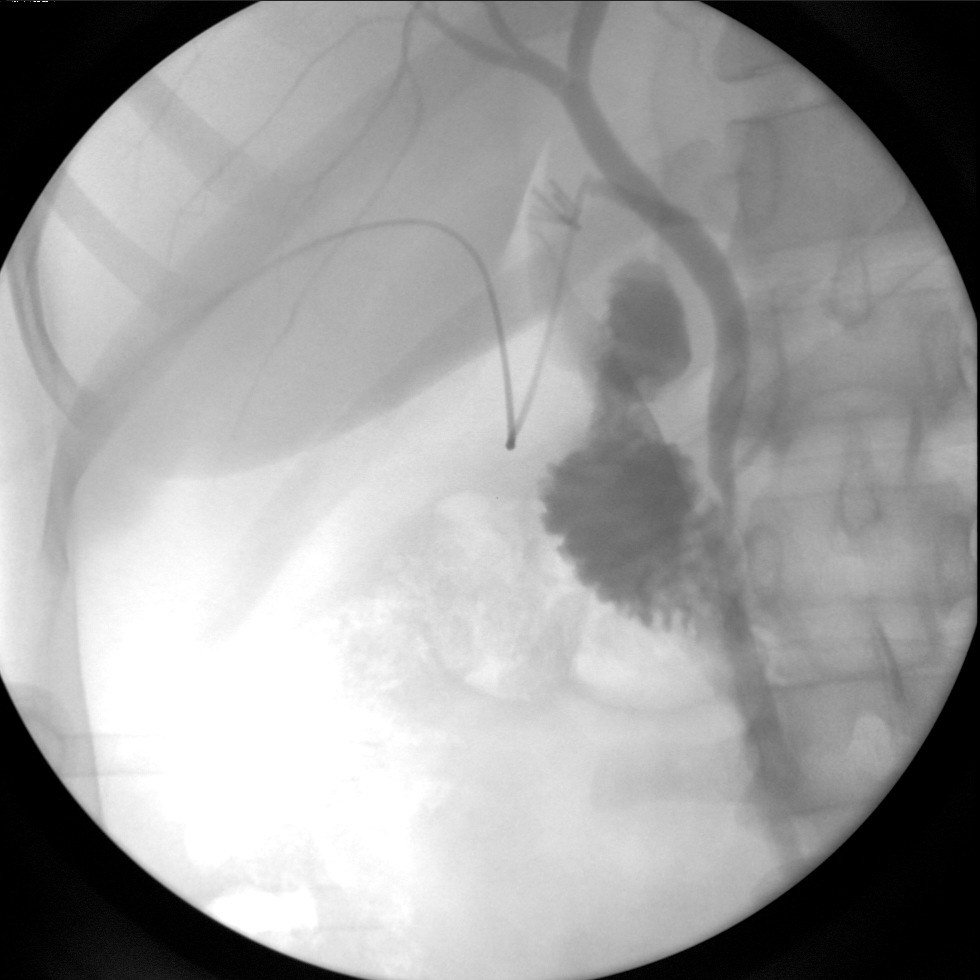
[im 1/2]
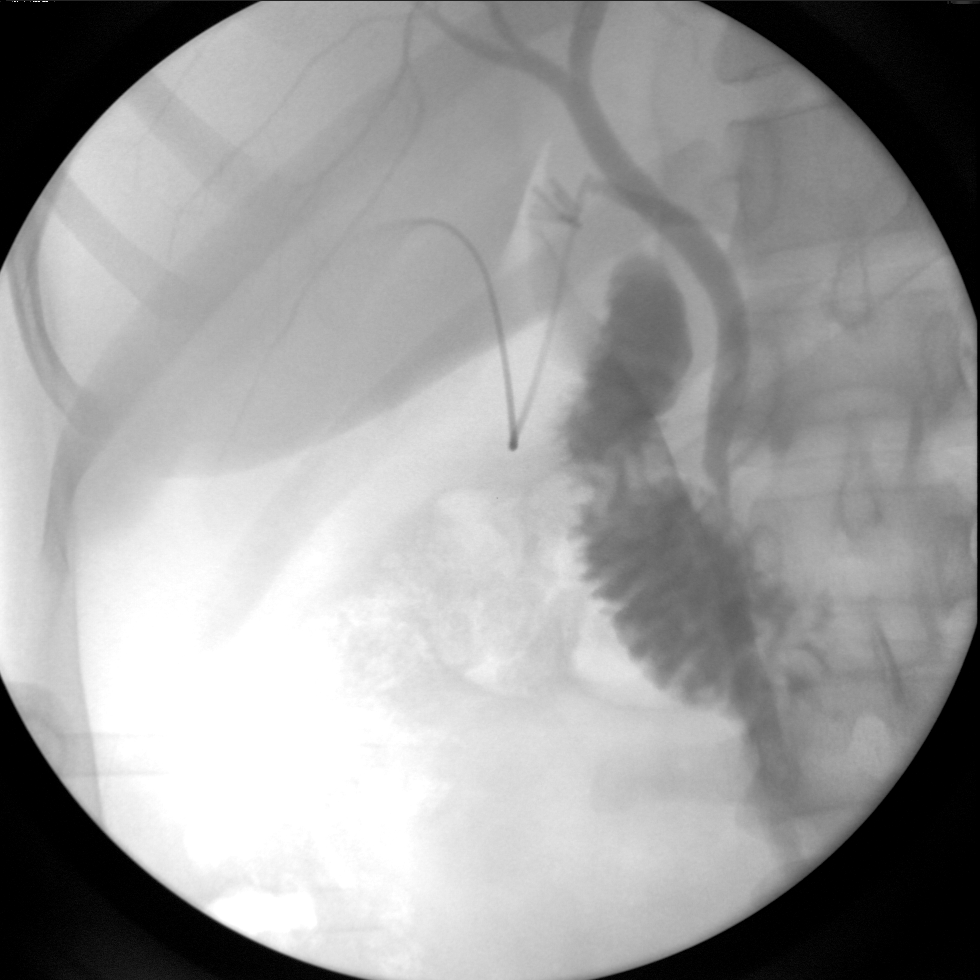
[im 2/2]
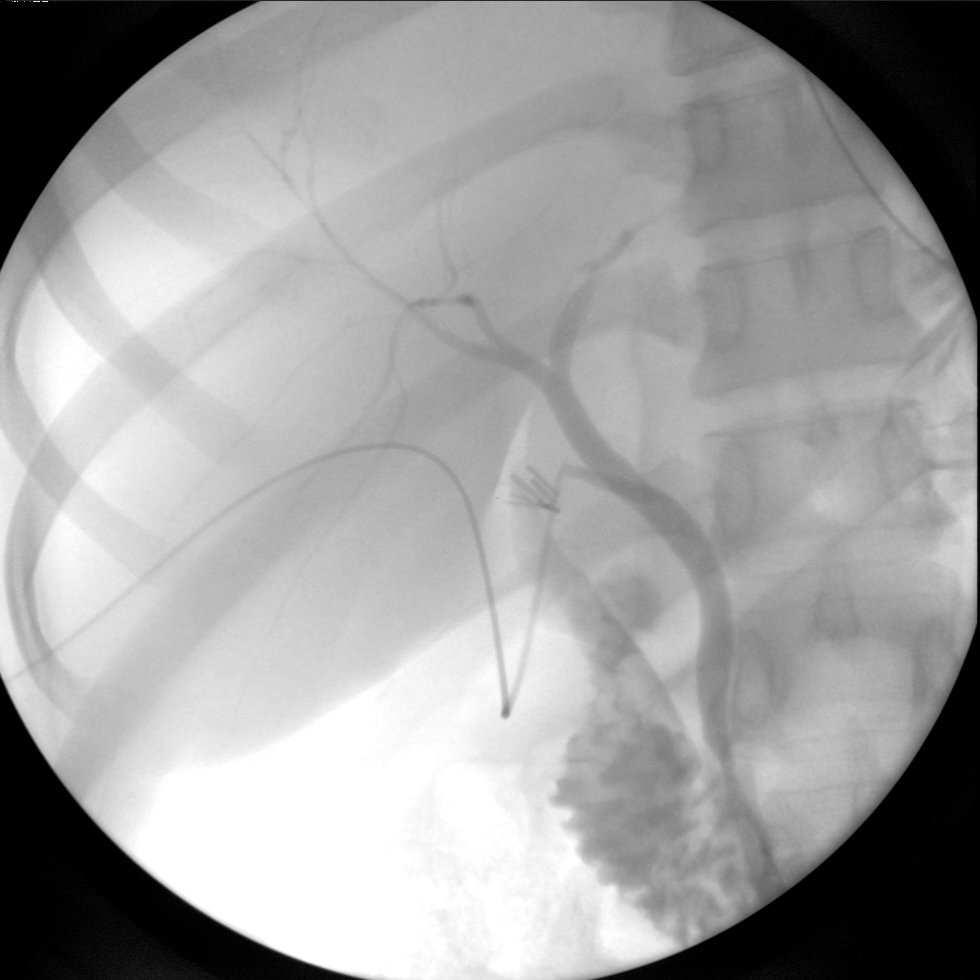

[5 of 5 positions shown; findings below may reference images not displayed]

FINDINGS: Cholangiogram obtained intraoperatively shows normally
patent and opacified bile ducts with contrast entering the
duodenum.  No filling defects or extravasation identified.
IMPRESSION: Normal intraoperative cholangiogram.

## 2012-10-20 SURGERY — LAPAROSCOPIC CHOLECYSTECTOMY WITH INTRAOPERATIVE CHOLANGIOGRAM
Anesthesia: General | Site: Abdomen | Wound class: Contaminated

## 2012-10-20 MED ORDER — SODIUM CHLORIDE 0.9 % IV SOLN
INTRAVENOUS | Status: DC | PRN
  Administered 2012-10-20: 11:00:00

## 2012-10-20 MED ORDER — DEXAMETHASONE SODIUM PHOSPHATE 4 MG/ML IJ SOLN
INTRAMUSCULAR | Status: DC | PRN
  Administered 2012-10-20: 8 mg via INTRAVENOUS

## 2012-10-20 MED ORDER — ACETAMINOPHEN 10 MG/ML IV SOLN
INTRAVENOUS | Status: AC
  Administered 2012-10-20: 1000 mg via INTRAVENOUS
  Filled 2012-10-20: qty 100

## 2012-10-20 MED ORDER — BUPIVACAINE-EPINEPHRINE 0.25% -1:200000 IJ SOLN
INTRAMUSCULAR | Status: DC | PRN
  Administered 2012-10-20: 30 mL

## 2012-10-20 MED ORDER — HYDROCODONE-ACETAMINOPHEN 5-325 MG PO TABS
1.0000 | ORAL_TABLET | ORAL | Status: DC | PRN
  Administered 2012-10-20 – 2012-10-21 (×2): 2 via ORAL
  Filled 2012-10-20 (×2): qty 2

## 2012-10-20 MED ORDER — FENTANYL CITRATE 0.05 MG/ML IJ SOLN
INTRAMUSCULAR | Status: DC | PRN
  Administered 2012-10-20 (×5): 50 ug via INTRAVENOUS
  Administered 2012-10-20: 100 ug via INTRAVENOUS
  Administered 2012-10-20 (×2): 50 ug via INTRAVENOUS

## 2012-10-20 MED ORDER — HYDROMORPHONE HCL PF 1 MG/ML IJ SOLN
0.2500 mg | INTRAMUSCULAR | Status: DC | PRN
  Administered 2012-10-20 (×2): 0.5 mg via INTRAVENOUS

## 2012-10-20 MED ORDER — MORPHINE SULFATE 2 MG/ML IJ SOLN
2.0000 mg | INTRAMUSCULAR | Status: DC | PRN
  Administered 2012-10-20 – 2012-10-21 (×6): 2 mg via INTRAVENOUS
  Filled 2012-10-20 (×6): qty 1

## 2012-10-20 MED ORDER — PROPOFOL 10 MG/ML IV BOLUS
INTRAVENOUS | Status: DC | PRN
  Administered 2012-10-20: 140 mg via INTRAVENOUS

## 2012-10-20 MED ORDER — SODIUM CHLORIDE 0.9 % IR SOLN
Status: DC | PRN
  Administered 2012-10-20: 1000 mL

## 2012-10-20 MED ORDER — NEOSTIGMINE METHYLSULFATE 1 MG/ML IJ SOLN
INTRAMUSCULAR | Status: DC | PRN
  Administered 2012-10-20: 3 mg via INTRAVENOUS

## 2012-10-20 MED ORDER — KCL IN DEXTROSE-NACL 30-5-0.45 MEQ/L-%-% IV SOLN
INTRAVENOUS | Status: DC
  Administered 2012-10-20 – 2012-10-21 (×2): via INTRAVENOUS
  Filled 2012-10-20 (×5): qty 1000

## 2012-10-20 MED ORDER — HYDROCODONE-ACETAMINOPHEN 5-325 MG PO TABS: 1.0000 | ORAL_TABLET | Freq: Four times a day (QID) | ORAL | Status: DC | PRN

## 2012-10-20 MED ORDER — ONDANSETRON HCL 4 MG/2ML IJ SOLN
4.0000 mg | Freq: Once | INTRAMUSCULAR | Status: AC | PRN
  Filled 2012-10-20: qty 2

## 2012-10-20 MED ORDER — ACETAMINOPHEN 325 MG PO TABS: 650.0000 mg | ORAL_TABLET | ORAL | Status: DC | PRN

## 2012-10-20 MED ORDER — ARTIFICIAL TEARS OP OINT
TOPICAL_OINTMENT | OPHTHALMIC | Status: DC | PRN
  Administered 2012-10-20: 1 via OPHTHALMIC

## 2012-10-20 MED ORDER — 0.9 % SODIUM CHLORIDE (POUR BTL) OPTIME
TOPICAL | Status: DC | PRN
  Administered 2012-10-20: 1000 mL

## 2012-10-20 MED ORDER — ONDANSETRON HCL 4 MG/2ML IJ SOLN
4.0000 mg | Freq: Four times a day (QID) | INTRAMUSCULAR | Status: DC | PRN
  Administered 2012-10-20: 4 mg via INTRAVENOUS

## 2012-10-20 MED ORDER — ACETAMINOPHEN 10 MG/ML IV SOLN
1000.0000 mg | Freq: Once | INTRAVENOUS | Status: AC | PRN
  Administered 2012-10-20: 1000 mg via INTRAVENOUS
  Filled 2012-10-20: qty 100

## 2012-10-20 MED ORDER — ONDANSETRON HCL 4 MG PO TABS: 4.0000 mg | ORAL_TABLET | Freq: Four times a day (QID) | ORAL | Status: DC | PRN

## 2012-10-20 MED ORDER — CEFAZOLIN SODIUM-DEXTROSE 2-3 GM-% IV SOLR
2.0000 g | Freq: Once | INTRAVENOUS | Status: AC
  Administered 2012-10-20: 2 g via INTRAVENOUS

## 2012-10-20 MED ORDER — ONDANSETRON HCL 4 MG/2ML IJ SOLN
INTRAMUSCULAR | Status: DC | PRN
  Administered 2012-10-20: 4 mg via INTRAVENOUS

## 2012-10-20 MED ORDER — LIDOCAINE HCL (CARDIAC) 20 MG/ML IV SOLN
INTRAVENOUS | Status: DC | PRN
  Administered 2012-10-20: 30 mg via INTRAVENOUS

## 2012-10-20 MED ORDER — MIDAZOLAM HCL 5 MG/5ML IJ SOLN
INTRAMUSCULAR | Status: DC | PRN
  Administered 2012-10-20: 2 mg via INTRAVENOUS

## 2012-10-20 MED ORDER — ROCURONIUM BROMIDE 100 MG/10ML IV SOLN
INTRAVENOUS | Status: DC | PRN
  Administered 2012-10-20: 5 mg via INTRAVENOUS
  Administered 2012-10-20: 40 mg via INTRAVENOUS

## 2012-10-20 MED ORDER — GLYCOPYRROLATE 0.2 MG/ML IJ SOLN
INTRAMUSCULAR | Status: DC | PRN
  Administered 2012-10-20: 0.4 mg via INTRAVENOUS

## 2012-10-20 MED ORDER — LACTATED RINGERS IV SOLN
INTRAVENOUS | Status: DC | PRN
  Administered 2012-10-20: 10:00:00 via INTRAVENOUS

## 2012-10-20 SURGICAL SUPPLY — 45 items
APL SKNCLS STERI-STRIP NONHPOA (GAUZE/BANDAGES/DRESSINGS) ×1
APPLIER CLIP ROT 10 11.4 M/L (STAPLE) ×2
APR CLP MED LRG 11.4X10 (STAPLE) ×1
BAG SPEC RTRVL LRG 6X4 10 (ENDOMECHANICALS)
BENZOIN TINCTURE PRP APPL 2/3 (GAUZE/BANDAGES/DRESSINGS) ×2 IMPLANT
CANISTER SUCTION 2500CC (MISCELLANEOUS) ×2 IMPLANT
CHLORAPREP W/TINT 26ML (MISCELLANEOUS) ×2 IMPLANT
CLIP APPLIE ROT 10 11.4 M/L (STAPLE) ×1 IMPLANT
CLOTH BEACON ORANGE TIMEOUT ST (SAFETY) ×2 IMPLANT
COVER MAYO STAND STRL (DRAPES) ×2 IMPLANT
COVER SURGICAL LIGHT HANDLE (MISCELLANEOUS) ×2 IMPLANT
DECANTER SPIKE VIAL GLASS SM (MISCELLANEOUS) ×2 IMPLANT
DRAPE C-ARM 42X72 X-RAY (DRAPES) ×2 IMPLANT
DRAPE UTILITY 15X26 W/TAPE STR (DRAPE) ×4 IMPLANT
ELECT REM PT RETURN 9FT ADLT (ELECTROSURGICAL) ×2
ELECTRODE REM PT RTRN 9FT ADLT (ELECTROSURGICAL) ×1 IMPLANT
GAUZE SPONGE 2X2 8PLY STRL LF (GAUZE/BANDAGES/DRESSINGS) ×1 IMPLANT
GLOVE BIO SURGEON STRL SZ7.5 (GLOVE) ×1 IMPLANT
GLOVE BIO SURGEON STRL SZ8 (GLOVE) ×1 IMPLANT
GLOVE BIOGEL PI IND STRL 7.5 (GLOVE) IMPLANT
GLOVE BIOGEL PI IND STRL 8 (GLOVE) IMPLANT
GLOVE BIOGEL PI INDICATOR 7.5 (GLOVE) ×1
GLOVE BIOGEL PI INDICATOR 8 (GLOVE) ×1
GLOVE SURG ORTHO 8.0 STRL STRW (GLOVE) ×2 IMPLANT
GOWN STRL NON-REIN LRG LVL3 (GOWN DISPOSABLE) ×6 IMPLANT
GOWN STRL REIN XL XLG (GOWN DISPOSABLE) ×3 IMPLANT
KIT BASIN OR (CUSTOM PROCEDURE TRAY) ×2 IMPLANT
KIT ROOM TURNOVER OR (KITS) ×2 IMPLANT
NS IRRIG 1000ML POUR BTL (IV SOLUTION) ×2 IMPLANT
PAD ARMBOARD 7.5X6 YLW CONV (MISCELLANEOUS) ×4 IMPLANT
POUCH SPECIMEN RETRIEVAL 10MM (ENDOMECHANICALS) IMPLANT
SCISSORS LAP 5X35 DISP (ENDOMECHANICALS) IMPLANT
SET CHOLANGIOGRAPH 5 50 .035 (SET/KITS/TRAYS/PACK) ×2 IMPLANT
SET IRRIG TUBING LAPAROSCOPIC (IRRIGATION / IRRIGATOR) ×2 IMPLANT
SLEEVE Z-THREAD 5X100MM (TROCAR) ×2 IMPLANT
SPECIMEN JAR SMALL (MISCELLANEOUS) ×2 IMPLANT
SPONGE GAUZE 2X2 STER 10/PKG (GAUZE/BANDAGES/DRESSINGS) ×1
SUT MNCRL AB 4-0 PS2 18 (SUTURE) ×2 IMPLANT
TOWEL OR 17X24 6PK STRL BLUE (TOWEL DISPOSABLE) ×2 IMPLANT
TOWEL OR 17X26 10 PK STRL BLUE (TOWEL DISPOSABLE) ×2 IMPLANT
TRAY LAPAROSCOPIC (CUSTOM PROCEDURE TRAY) ×2 IMPLANT
TROCAR XCEL BLUNT TIP 100MML (ENDOMECHANICALS) ×2 IMPLANT
TROCAR Z-THREAD FIOS 11X100 BL (TROCAR) ×2 IMPLANT
TROCAR Z-THREAD FIOS 5X100MM (TROCAR) ×2 IMPLANT
WATER STERILE IRR 1000ML POUR (IV SOLUTION) IMPLANT

## 2012-10-20 NOTE — Progress Notes (Signed)
Report given to Angel RN.

## 2012-10-20 NOTE — Anesthesia Preprocedure Evaluation (Signed)
Anesthesia Evaluation  Patient identified by MRN, date of birth, ID band Patient awake and Patient confused    Reviewed: Allergy & Precautions, H&P , NPO status , Patient's Chart, lab work & pertinent test results  Airway Mallampati: II TM Distance: >3 FB     Dental  (+) Teeth Intact and Dental Advisory Given   Pulmonary  breath sounds clear to auscultation        Cardiovascular Rhythm:Regular Rate:Normal     Neuro/Psych    GI/Hepatic   Endo/Other    Renal/GU      Musculoskeletal   Abdominal   Peds  Hematology   Anesthesia Other Findings   Reproductive/Obstetrics                           Anesthesia Physical Anesthesia Plan  ASA: II  Anesthesia Plan: General   Post-op Pain Management:    Induction: Intravenous  Airway Management Planned: Oral ETT  Additional Equipment:   Intra-op Plan:   Post-operative Plan: Extubation in OR  Informed Consent: I have reviewed the patients History and Physical, chart, labs and discussed the procedure including the risks, benefits and alternatives for the proposed anesthesia with the patient or authorized representative who has indicated his/her understanding and acceptance.   Dental advisory given  Plan Discussed with: CRNA and Surgeon  Anesthesia Plan Comments: (Symptomatic Cholelithiasis Speaks no English  Plan GA with oral ETT  Kipp Brood, MD)        Anesthesia Quick Evaluation

## 2012-10-20 NOTE — Preoperative (Signed)
Beta Blockers   Reason not to administer Beta Blockers:Not Applicable 

## 2012-10-20 NOTE — Op Note (Signed)
Procedure Note  Pre-operative Diagnosis: Calculus of gallbladder with other cholecystitis, without mention of obstruction  Post-operative Diagnosis: Same  Surgeon:  Velora Heckler, MD, FACS  Procedure:  Laparoscopic cholecystectomy with intra-operative cholangiography  Assistant:  Violeta Gelinas, MD    Anesthesia:  General  Indications: This patient presents with symptomatic gallbladder disease and will undergo laparoscopic cholecystectomy with intraoperative cholangiography.  Procedure Details: The patient was seen in the pre-op holding area. The risks, benefits, complications, treatment options, and expected outcomes have been discussed with the patient. The patient and/or family agreed with the proposed plan and signed the informed consent form.  The patient was taken to Operating Room, identified as Rhonda Waters and the procedure verified as Laparoscopic Cholecystectomy with Intraoperative Cholangiogram. A "time out" was completed and the above information confirmed.  Prior to the induction of general anesthesia, antibiotic prophylaxis was administered. General endotracheal anesthesia was then administered and tolerated well. After the induction, the abdomen was prepped in the usual strict aseptic fashion. The patient was in the supine position.  An incision was made in the skin near the umbilicus. The midline fascia was incised and the peritoneal cavity entered and the Hasson canula was introduced under direct vision. Hasson canula was secured with a pursestring 0-Vicryl suture. Pneumoperitoneum was then established with carbon dioxide and tolerated well without any adverse changes in the patient's vital signs. Additional trocars were introduced under direct vision along the right costal margin in the midline, mid-clavicular line, and anterior axillary line.  The gallbladder was identified and the fundus grasped and retracted cephalad. Adhesions were taken down bluntly and with  the electrocautery as needed, taking care not to injure any adjacent structures. The infundibulum was grasped and retracted laterally, exposing the peritoneum overlying the triangle of Calot. This was incised and structures exposed in a blunt fashion. The cystic duct was clearly identified and bluntly dissected circumferentially and clipped at the neck of the gallbladder.  An incision was made in the cystic duct and the cholangiogram catheter introduced. The catheter was secured using an ligaclip.  Real-time cholangiography was performed using the C-arm.  There was rapid filling of a normal caliber common bile duct.  There was reflux of contrast into the left and right hepatic ductal systems.  There was free flow distally into the duodenum without filling defect or obstruction.  Catheter was removed from the peritoneal cavity.  The cystic duct was then triply ligated with surgical clips and divided. The cystic artery was identified, dissected circumferentially, ligated with ligaclips, and divided.   The gallbladder was dissected from the liver bed with the electrocautery used for hemostasis. The gallbladder was completely removed and placed into an endocatch bag. The right upper quadrant was irrigated and inspected. Hemostasis was achieved with the electrocautery. Warm saline irrigation was utilized and was repeatedly aspirated until clear.  Pneumoperitoneum was released after viewing removal of the trocars with good hemostasis noted. The umbilical wound was irrigated and the fascia was then closed with the pursestring suture.  The skin was then closed with 4-0 Monocril subcuticular sutures and sterile dressings were applied.  Instrument, sponge, and needle counts were correct at the conclusion of the case.  The patient tolerated the procedure well.  Estimated Blood Loss: Minimal         Drains: none         Specimens: Gallbladder to pathology         Disposition: PACU - hemodynamically stable.  Condition: stable   Earnstine Regal, MD, [facility] Office: [phone]

## 2012-10-20 NOTE — Transfer of Care (Signed)
Immediate Anesthesia Transfer of Care Note  Patient: Rhonda Waters  Procedure(s) Performed: Procedure(s) (LRB) with comments: LAPAROSCOPIC CHOLECYSTECTOMY WITH INTRAOPERATIVE CHOLANGIOGRAM (N/A)  Patient Location: PACU  Anesthesia Type:General  Level of Consciousness: awake, alert  and patient cooperative  Airway & Oxygen Therapy: Patient Spontanous Breathing and Patient connected to nasal cannula oxygen  Post-op Assessment: Report given to PACU RN, Post -op Vital signs reviewed and stable and Patient moving all extremities  Post vital signs: Reviewed and stable  Complications: No apparent anesthesia complications

## 2012-10-20 NOTE — Anesthesia Postprocedure Evaluation (Signed)
  Anesthesia Post-op Note  Patient: Rhonda Waters  Procedure(s) Performed: Procedure(s) (LRB) with comments: LAPAROSCOPIC CHOLECYSTECTOMY WITH INTRAOPERATIVE CHOLANGIOGRAM (N/A)  Patient Location: PACU  Anesthesia Type:General  Level of Consciousness: awake  Airway and Oxygen Therapy: Patient Spontanous Breathing  Post-op Pain: mild  Post-op Assessment: Post-op Vital signs reviewed  Post-op Vital Signs: stable  Complications: No apparent anesthesia complications

## 2012-10-20 NOTE — Anesthesia Procedure Notes (Signed)
Procedure Name: Intubation Date/Time: 10/20/2012 10:42 AM Performed by: Jefm Miles E Pre-anesthesia Checklist: Patient identified, Timeout performed, Emergency Drugs available, Suction available and Patient being monitored Patient Re-evaluated:Patient Re-evaluated prior to inductionOxygen Delivery Method: Circle system utilized Preoxygenation: Pre-oxygenation with 100% oxygen Intubation Type: IV induction Ventilation: Mask ventilation without difficulty Laryngoscope Size: Mac and 3 Grade View: Grade II Tube type: Oral Tube size: 7.0 mm Number of attempts: 1 Airway Equipment and Method: Stylet Placement Confirmation: ETT inserted through vocal cords under direct vision,  breath sounds checked- equal and bilateral and positive ETCO2 Secured at: 22 cm Tube secured with: Tape Dental Injury: Teeth and Oropharynx as per pre-operative assessment

## 2012-10-21 NOTE — Discharge Summary (Signed)
Physician Discharge Summary  Patient ID: Rhonda Waters MRN: [id number] DOB/AGE: [birth year] 28 y.o.  Admit date: 10/19/2012 Discharge date: 10/21/2012  Admission Diagnoses: Cholecystitis  Discharge Diagnoses:  Principal Problem:  *Cholecystitis with cholelithiasis   Discharged Condition: good  Hospital Course: 38 yof admitted with cholecystitis underwent uncomplicated laparoscopic cholecystectomy.  Ready for discharge following day  Consults: None  Significant Diagnostic Studies: none  Treatments: surgery: lap chole   Discharge Exam: Blood pressure 98/67, pulse 100, temperature 98.4 F (36.9 C), temperature source Oral, resp. rate 17, height 5\' 2"  (1.575 m), weight 186 lb 11.7 oz (84.7 kg), last menstrual period 09/28/2012, SpO2 99.00%. abdomen soft, approp tender wounds dressed  Disposition: 01-Home or Self Care     Medication List     As of 10/21/2012  9:43 AM    TAKE these medications         HYDROcodone-acetaminophen 5-325 MG per tablet   Commonly known as: NORCO/VICODIN   Take 1-2 tablets by mouth every 6 (six) hours as needed.           Follow-up Information    Follow up with [facility]. Schedule an appointment as soon as possible for a visit in 2 weeks.   Contact information:   [address]   [place] [address] [phone]          Signed: Emelia Loron 10/21/2012, 9:43 AM

## 2012-10-23 ENCOUNTER — Encounter (HOSPITAL_COMMUNITY): Payer: Self-pay | Admitting: Surgery

## 2012-10-25 ENCOUNTER — Telehealth (INDEPENDENT_AMBULATORY_CARE_PROVIDER_SITE_OTHER): Payer: Self-pay

## 2012-10-25 NOTE — Telephone Encounter (Signed)
Rhonda Waters given appt date of 11-15-12. She will call pt to translate date and time.

## 2012-11-01 NOTE — ED Provider Notes (Signed)
History     CSN: [id number]  Arrival date & time 10/19/12  [id number]   First MD Initiated Contact with Patient 10/19/12 [phone]      No chief complaint on file.   (Consider location/radiation/quality/duration/timing/severity/associated sxs/prior treatment) HPI Comments: Rhonda Waters is a 27 y.o. Female who presents for evaluation of right upper quadrant pain for several days. She is associated nausea and vomiting, but no diarrhea. She denies fever. She is interviewed by family members who translate for her. She's had similar episodes in the past. She is unable to eat or drink because it aggravates her symptoms. There are no palliative factors  The history is provided by the patient.    Past Medical History  Diagnosis Date  . GERD (gastroesophageal reflux disease)   . Nausea & vomiting 10/20/2011    Past Surgical History  Procedure Date  . No past surgeries   . Cholecystectomy 10/20/2012    Procedure: LAPAROSCOPIC CHOLECYSTECTOMY WITH INTRAOPERATIVE CHOLANGIOGRAM;  Surgeon: Velora Heckler, MD;  Location: [facility] OR;  Service: General;  Laterality: N/A;    History reviewed. No pertinent family history.  History  Substance Use Topics  . Smoking status: Never Smoker   . Smokeless tobacco: Never Used  . Alcohol Use: No    OB History    Grav Para Term Preterm Abortions TAB SAB Ect Mult Living                  Review of Systems  All other systems reviewed and are negative.    Allergies  Review of patient's allergies indicates no known allergies.  Home Medications   Current Outpatient Rx  Name  Route  Sig  Dispense  Refill  . HYDROCODONE-ACETAMINOPHEN 5-325 MG PO TABS   Oral   Take 1-2 tablets by mouth every 6 (six) hours as needed.   40 tablet   0     BP 104/47  Pulse 101  Temp 98.1 F (36.7 C) (Oral)  Resp 18  Ht 5\' 2"  (1.575 m)  Wt 186 lb 11.7 oz (84.7 kg)  BMI 34.15 kg/m2  SpO2 97%  LMP 09/28/2012  Physical Exam  Nursing note and vitals  reviewed. Constitutional: She is oriented to person, place, and time. She appears well-developed and well-nourished.  HENT:  Head: Normocephalic and atraumatic.  Eyes: Conjunctivae normal and EOM are normal. Pupils are equal, round, and reactive to light.  Neck: Normal range of motion and phonation normal. Neck supple.  Cardiovascular: Normal rate, regular rhythm and intact distal pulses.   Pulmonary/Chest: Effort normal and breath sounds normal. She exhibits no tenderness.  Abdominal: Soft. She exhibits no distension. There is tenderness (Right upper quadrant, moderate). There is no guarding.  Musculoskeletal: Normal range of motion.  Neurological: She is alert and oriented to person, place, and time. She has normal strength. She exhibits normal muscle tone.  Skin: Skin is warm and dry.  Psychiatric: She has a normal mood and affect. Her behavior is normal. Judgment and thought content normal.    ED Course  Procedures (including critical care time)   Emergency department treatment: IV fluids, IV, Dilaudid, and IV Zofran. Patient's pain could not be eliminated in the emergency department  Consultation- Gen. surgery to arrange for admission, for cholecystectomy   Labs Reviewed  CBC WITH DIFFERENTIAL - Abnormal; Notable for the following:    WBC 14.7 (*)     Hemoglobin 11.6 (*)     HCT 34.8 (*)     Neutrophils Relative 79 (*)  Neutro Abs 11.5 (*)     All other components within normal limits  COMPREHENSIVE METABOLIC PANEL - Abnormal; Notable for the following:    Total Bilirubin 0.2 (*)     All other components within normal limits  URINALYSIS, ROUTINE W REFLEX MICROSCOPIC - Abnormal; Notable for the following:    APPearance CLOUDY (*)     Specific Gravity, Urine 1.037 (*)     Leukocytes, UA TRACE (*)     All other components within normal limits  URINE MICROSCOPIC-ADD ON - Abnormal; Notable for the following:    Squamous Epithelial / LPF MANY (*)     Bacteria, UA FEW (*)      All other components within normal limits  COMPREHENSIVE METABOLIC PANEL - Abnormal; Notable for the following:    BUN 5 (*)     Creatinine, Ser 0.49 (*)     Albumin 3.0 (*)     All other components within normal limits  CBC - Abnormal; Notable for the following:    WBC 13.1 (*)     RBC 3.65 (*)     Hemoglobin 10.6 (*)     HCT 31.4 (*)     All other components within normal limits  LIPASE, BLOOD  URINE CULTURE  PREGNANCY, URINE  SURGICAL PCR SCREEN  LAB REPORT - SCANNED  SURGICAL PATHOLOGY      1. Cholelithiasis       MDM  Evaluation is consistent with acute gallbladder disease, requiring surgical removal. Her pain could not be controlled. In the ED. she is admitted for surgical management        Flint Melter, MD 11/01/12 [phone]

## 2012-11-14 ENCOUNTER — Encounter (INDEPENDENT_AMBULATORY_CARE_PROVIDER_SITE_OTHER): Payer: Self-pay | Admitting: Surgery

## 2012-11-14 ENCOUNTER — Ambulatory Visit (INDEPENDENT_AMBULATORY_CARE_PROVIDER_SITE_OTHER): Payer: PRIVATE HEALTH INSURANCE | Admitting: Surgery

## 2012-11-14 VITALS — BP 110/76 | HR 75 | Temp 97.3°F | Resp 16 | Ht 61.5 in | Wt 181.2 lb

## 2012-11-14 DIAGNOSIS — K8010 Calculus of gallbladder with chronic cholecystitis without obstruction: Secondary | ICD-10-CM

## 2012-11-14 NOTE — Progress Notes (Signed)
General Surgery [facility]  Visit Diagnoses: 1. Cholecystitis with cholelithiasis     HISTORY: The patient is a 27 year old Hispanic female who underwent laparoscopic cholecystectomy for acute cholecystitis on 10/19/2012. Postoperative course has been uneventful. Final pathology shows cholecystitis and cholelithiasis.  EXAM: Surgical dressings remain in place. These are removed today. All Steri-Strips are removed. Wounds have healed nicely. No sign of infection. No sign of herniation. Right upper quadrant is soft and nontender without mass.  IMPRESSION: Acute cholecystitis, cholelithiasis  PLAN: Patient is instructed in local wound care. She will begin applying topical creams to her incisions. She will return for follow-up as needed.  Rhonda Heckler, MD, FACS General & Endocrine Surgery [facility]

## 2012-11-14 NOTE — Patient Instructions (Signed)
  COCOA BUTTER & VITAMIN E CREAM  ([employer] or other brand)  Apply cocoa butter/vitamin E cream to your incision 2 - 3 times daily.  Massage cream into incision for one minute with each application.  Use sunscreen (50 SPF or higher) for first 6 months after surgery if area is exposed to sun.  You may substitute Mederma or other scar reducing creams as desired.   

## 2013-01-18 ENCOUNTER — Ambulatory Visit: Payer: No Typology Code available for payment source | Admitting: Family Medicine

## 2013-05-09 ENCOUNTER — Ambulatory Visit: Payer: No Typology Code available for payment source | Admitting: Family Medicine

## 2013-05-16 ENCOUNTER — Ambulatory Visit (INDEPENDENT_AMBULATORY_CARE_PROVIDER_SITE_OTHER): Payer: No Typology Code available for payment source | Admitting: Family Medicine

## 2013-05-16 ENCOUNTER — Encounter: Payer: Self-pay | Admitting: Family Medicine

## 2013-05-16 VITALS — BP 119/67 | HR 82 | Temp 98.6°F | Ht 62.0 in | Wt 188.4 lb

## 2013-05-16 DIAGNOSIS — R1013 Epigastric pain: Secondary | ICD-10-CM

## 2013-05-16 DIAGNOSIS — R109 Unspecified abdominal pain: Secondary | ICD-10-CM

## 2013-05-16 DIAGNOSIS — R12 Heartburn: Secondary | ICD-10-CM

## 2013-05-16 DIAGNOSIS — Z8719 Personal history of other diseases of the digestive system: Secondary | ICD-10-CM

## 2013-05-16 DIAGNOSIS — G8929 Other chronic pain: Secondary | ICD-10-CM

## 2013-05-16 LAB — POCT URINALYSIS DIPSTICK
Leukocytes, UA: NEGATIVE
Nitrite, UA: NEGATIVE
Protein, UA: NEGATIVE
Urobilinogen, UA: 0.2

## 2013-05-16 LAB — POCT H PYLORI SCREEN: H Pylori Screen, POC: POSITIVE

## 2013-05-16 MED ORDER — OMEPRAZOLE 20 MG PO CPDR: 20.0000 mg | DELAYED_RELEASE_CAPSULE | Freq: Every day | ORAL | Status: DC

## 2013-05-16 NOTE — Patient Instructions (Addendum)
Acidez   (Heartburn)   La acidez es una sensación de dolor y quemazón en el pecho. Puede empeorar en ciertas posiciones, como estando acostado o inclinado hacia adelante. Se produce cuando el ácido del estómago vuelve al conducto por el que bajan los alimentos desde la boca al estómago (esófago inferior).   CAUSAS   · Comidas abundantes.  · Ciertos alimentos y bebidas.  · La práctica de ejercicios.  · Aumento en la producción de ácido .  · Tener sobrepeso o ser obeso.  · Ciertos medicamentos.  SÍNTOMAS   · Sensación de ardor en el pecho o en la parte inferior de la garganta.  · Gusto amargo en la boca.  · Tos.  DIAGNÓSTICO   Si los tratamientos habituales no lo mejoran, habrá que realizar estudios para ver si se trata de otra enfermedad. Las pruebas habituales son:   · Radiografías.  · Endoscopía. En este procedimiento se usa un tubo con una luz y una cámara en un extremo, y se examina el esófago y el estómago.  · Un análisis para medir la cantidad de ácido en el estómago (prueba de PH).  · Prueba para ver si el esófago funciona adecuadamente (manometría esofágica).  · Análisis de sangre, de la respiración o de materia fecal para ver si hay una bacteria que produce las úlceras.  TRATAMIENTO   · El médico aconsejará sobre el uso de medicamentos de venta libre (antiácidos, medicamentos para disminuir la acidez) en los casos de síntomas leves.  · El médico indicará medicamentos para disminuir el ácido estomacal o para proteger la superficie del estómago.  · El profesional indicará un cambio en la dieta.  · En casos graves, el médico recomendará que eleve la cabecera de la cama con bloques. (Dormir con más almohadas no es un tratamiento efectivo, ya que sólo modifica la posición de la cabeza y no mejora el problema principal del reflujo ácido del estómago al esófago.)  INSTRUCCIONES PARA EL CUIDADO EN EL HOGAR   · Tome todos los medicamentos según le indicó su médico.  · Eleve la cabecera de la cama colocando bloques  debajo de las patas, si el médico lo aconsejó.  · No haga ejercicios enseguida después de comer.  · Evite comer 2 ó 3 horas antes de ir a dormir. No se acueste enseguida después de comer.  · Haga comidas pequeñas durante el día en lugar de 3 comidas abundantes.  · Si fuma, abandone el hábito.  · Mantenga un peso saludable.  · Identifique los alimentos o las bebidas que empeoran sus síntomas y evítelos. Los alimentos que debe evitar son:  · Pimienta.  · Chocolate.  · Alimentos con alto contenido de grasas, incluyendo las comidas fritas.  · Comidas muy condimentadas.  · Ajo y cebolla.  · Cítricos, como naranja, pomelo, limón y lima.  · Alimentos o productos que contengan tomate.  · Menta.  · Bebidas carbonatadas, que contengan cafeína y alcohol.  · Vinagre.  SOLICITE ATENCIÓN MÉDICA DE INMEDIATO SI:   · Siente un dolor intenso en el pecho que baja por el brazo o va hacia al mandíbula o el cuello.  · Se siente mareado o sufre un desmayo.  · Comienza a sentir falta de aire.  · Vomita sangre.  · Tiene dificultad o dolor al tragar.  · La materia fecal (heces) es negra, de aspecto alquitranado.  · Tiene acidez más de 3 veces por semana, durante más de 2 semanas.  ASEGÚRESE DE QUE:   ·   Comprende estas instrucciones.  · Controlará su enfermedad.  · Solicitará ayuda de inmediato si no mejora o si empeora.  Document Released: 06/16/2011 Document Revised: 12/27/2011  Exit[employer]® Patient Information ©2014 [employer].

## 2013-05-16 NOTE — Progress Notes (Signed)
Family Medicine Office Visit Note   Subjective:   Patient ID: Rhonda Waters, female  DOB: July 19, 1986, 27 y.o.. MRN: [id number]   Visit conducted in Spanish. Pt that comes today for same day appointment complaining of abdominal pain related to urine frequency for about a month. Denies dysuria and flak pain, fever or chills. Denies nausea or vomiting or other symptoms  Reports upper abdominal pain gets worse with food ingestion. Pt reports Advil and Tylenol seems to help. Positive for heartburn.   Review of Systems:  Per HPI  Objective:   Physical Exam: Gen:  NAD HEENT: Moist mucous membranes  CV: Regular rate and rhythm, no murmurs rubs or gallops PULM: Clear to auscultation bilaterally. No wheezes/rales/rhonchi ABD: Soft, mildly tender on epigastrium without guarding or rebound tenderness. Non distended, normal bowel sounds. No CVA tenderness EXT: No edema Neuro: Alert and oriented x3. No focalization  Assessment & Plan:

## 2013-05-17 DIAGNOSIS — R1013 Epigastric pain: Secondary | ICD-10-CM | POA: Insufficient documentation

## 2013-05-17 DIAGNOSIS — G8929 Other chronic pain: Secondary | ICD-10-CM | POA: Insufficient documentation

## 2013-05-17 NOTE — Assessment & Plan Note (Signed)
Symptoms of GERD and epigastric pain. High risk population for H Pylori infection. UA negative for infection.  P/ H Pylori screening/ if positive will start triple therapy. PPI

## 2013-05-25 ENCOUNTER — Other Ambulatory Visit: Payer: Self-pay | Admitting: Family Medicine

## 2013-05-25 ENCOUNTER — Telehealth: Payer: Self-pay | Admitting: *Deleted

## 2013-05-25 DIAGNOSIS — R1013 Epigastric pain: Secondary | ICD-10-CM

## 2013-05-25 MED ORDER — CLARITHROMYCIN 500 MG PO TABS: 500.0000 mg | ORAL_TABLET | Freq: Two times a day (BID) | ORAL | Status: DC

## 2013-05-25 MED ORDER — AMOXICILLIN 500 MG PO CAPS: 1000.0000 mg | ORAL_CAPSULE | Freq: Two times a day (BID) | ORAL | Status: DC

## 2013-05-25 NOTE — Assessment & Plan Note (Addendum)
H Pylori positive. Needs triple therapy. Pt not allergic to Penicillin. Amoxicillin 1g PO BID plus Clarithromycin 500mg  PO BID. Continue Omeprazole 20 mg. 10 days full course then reevaluation with me in 4 weeks.

## 2013-05-25 NOTE — Telephone Encounter (Signed)
Letter was mailed to patient per Dr Carlyn Reichert was unable to reach her by phone ,she called because she's spanish speaking only . Rhonda Waters, Virgel Bouquet

## 2014-01-17 ENCOUNTER — Encounter (HOSPITAL_COMMUNITY): Payer: Self-pay | Admitting: Emergency Medicine

## 2014-01-17 DIAGNOSIS — Z9089 Acquired absence of other organs: Secondary | ICD-10-CM | POA: Insufficient documentation

## 2014-01-17 DIAGNOSIS — R112 Nausea with vomiting, unspecified: Secondary | ICD-10-CM | POA: Insufficient documentation

## 2014-01-17 DIAGNOSIS — K219 Gastro-esophageal reflux disease without esophagitis: Secondary | ICD-10-CM | POA: Insufficient documentation

## 2014-01-17 DIAGNOSIS — D72829 Elevated white blood cell count, unspecified: Secondary | ICD-10-CM | POA: Insufficient documentation

## 2014-01-17 DIAGNOSIS — Z3202 Encounter for pregnancy test, result negative: Secondary | ICD-10-CM | POA: Insufficient documentation

## 2014-01-17 DIAGNOSIS — R1032 Left lower quadrant pain: Secondary | ICD-10-CM | POA: Insufficient documentation

## 2014-01-17 LAB — CBC WITH DIFFERENTIAL/PLATELET
Basophils Absolute: 0 10*3/uL (ref 0.0–0.1)
Basophils Relative: 0 % (ref 0–1)
EOS ABS: 0.1 10*3/uL (ref 0.0–0.7)
EOS PCT: 1 % (ref 0–5)
HEMATOCRIT: 38.1 % (ref 36.0–46.0)
HEMOGLOBIN: 13.2 g/dL (ref 12.0–15.0)
LYMPHS ABS: 2.9 10*3/uL (ref 0.7–4.0)
LYMPHS PCT: 26 % (ref 12–46)
MCH: 29.9 pg (ref 26.0–34.0)
MCHC: 34.6 g/dL (ref 30.0–36.0)
MCV: 86.2 fL (ref 78.0–100.0)
MONO ABS: 0.5 10*3/uL (ref 0.1–1.0)
MONOS PCT: 4 % (ref 3–12)
Neutro Abs: 7.6 10*3/uL (ref 1.7–7.7)
Neutrophils Relative %: 69 % (ref 43–77)
PLATELETS: 281 10*3/uL (ref 150–400)
RBC: 4.42 MIL/uL (ref 3.87–5.11)
RDW: 13.5 % (ref 11.5–15.5)
WBC: 11.1 10*3/uL — AB (ref 4.0–10.5)

## 2014-01-17 LAB — URINE MICROSCOPIC-ADD ON

## 2014-01-17 LAB — COMPREHENSIVE METABOLIC PANEL
ALK PHOS: 82 U/L (ref 39–117)
ALT: 17 U/L (ref 0–35)
AST: 16 U/L (ref 0–37)
Albumin: 3.9 g/dL (ref 3.5–5.2)
BUN: 14 mg/dL (ref 6–23)
CALCIUM: 9.1 mg/dL (ref 8.4–10.5)
CO2: 18 meq/L — AB (ref 19–32)
Chloride: 103 mEq/L (ref 96–112)
Creatinine, Ser: 0.63 mg/dL (ref 0.50–1.10)
GLUCOSE: 136 mg/dL — AB (ref 70–99)
Potassium: 3.5 mEq/L — ABNORMAL LOW (ref 3.7–5.3)
SODIUM: 138 meq/L (ref 137–147)
Total Bilirubin: 0.3 mg/dL (ref 0.3–1.2)
Total Protein: 7.6 g/dL (ref 6.0–8.3)

## 2014-01-17 LAB — URINALYSIS, ROUTINE W REFLEX MICROSCOPIC
Glucose, UA: NEGATIVE mg/dL
HGB URINE DIPSTICK: NEGATIVE
Ketones, ur: 15 mg/dL — AB
Leukocytes, UA: NEGATIVE
NITRITE: NEGATIVE
PROTEIN: 30 mg/dL — AB
Specific Gravity, Urine: 1.03 (ref 1.005–1.030)
UROBILINOGEN UA: 1 mg/dL (ref 0.0–1.0)
pH: 6 (ref 5.0–8.0)

## 2014-01-17 LAB — PREGNANCY, URINE: PREG TEST UR: NEGATIVE

## 2014-01-17 LAB — POC URINE PREG, ED: Preg Test, Ur: NEGATIVE

## 2014-01-17 NOTE — ED Notes (Signed)
Pt. reports LLQ pain with nausea and vomitting onset last night , denies fever , slight chills , no vaginal discharge or dysuria .

## 2014-01-18 ENCOUNTER — Emergency Department (HOSPITAL_COMMUNITY): Payer: Self-pay

## 2014-01-18 ENCOUNTER — Emergency Department (HOSPITAL_COMMUNITY)
Admission: EM | Admit: 2014-01-18 | Discharge: 2014-01-18 | Disposition: A | Discharge status: Home / Self Care | Payer: Self-pay | Attending: Emergency Medicine | Admitting: Emergency Medicine

## 2014-01-18 DIAGNOSIS — R109 Unspecified abdominal pain: Secondary | ICD-10-CM

## 2014-01-18 MED ORDER — ONDANSETRON 4 MG PO TBDP: 4.0000 mg | ORAL_TABLET | Freq: Three times a day (TID) | ORAL | Status: DC | PRN

## 2014-01-18 MED ORDER — HYDROMORPHONE HCL PF 1 MG/ML IJ SOLN
1.0000 mg | Freq: Once | INTRAMUSCULAR | Status: AC
  Administered 2014-01-18: 1 mg via INTRAVENOUS
  Filled 2014-01-18: qty 1

## 2014-01-18 MED ORDER — NAPROXEN 500 MG PO TABS: 500.0000 mg | ORAL_TABLET | Freq: Two times a day (BID) | ORAL | Status: DC

## 2014-01-18 MED ORDER — IOHEXOL 300 MG/ML  SOLN
100.0000 mL | Freq: Once | INTRAMUSCULAR | Status: AC | PRN
  Administered 2014-01-18: 100 mL via INTRAVENOUS

## 2014-01-18 MED ORDER — HYDROCODONE-ACETAMINOPHEN 5-325 MG PO TABS: 2.0000 | ORAL_TABLET | ORAL | Status: DC | PRN

## 2014-01-18 MED ORDER — IOHEXOL 300 MG/ML  SOLN
25.0000 mL | Freq: Once | INTRAMUSCULAR | Status: AC | PRN
  Administered 2014-01-18: 25 mL via ORAL

## 2014-01-18 NOTE — ED Notes (Addendum)
Discharge instructions reviewed with the pt using the spanish interpreter phone.  Pt understood the information provided and verbalized understanding for follow up and prescriptions to be filled with local pharmacy.

## 2014-01-18 NOTE — Discharge Instructions (Signed)
Please call your doctor for a followup appointment within 24-48 hours. When you talk to your doctor please let them know that you were seen in the emergency department and have them acquire all of your records so that they can discuss the findings with you and formulate a treatment plan to fully care for your new and ongoing problems. ° °

## 2014-01-18 NOTE — ED Notes (Signed)
Through the interpreter service pt states the Abdominal pain in the lower left quadrant started hurting with pain 9/10 2 nights ago.  The pain is a burning pain that worsens when she eats.  Emesis x10, no diarrhea.  Pt has had gallbladder surgery in the past (2014).  No trouble or pain during urination, no burning, no vaginal discharge.

## 2014-01-18 NOTE — ED Notes (Signed)
CT notified pt has completed contrast solution.

## 2014-01-18 NOTE — ED Provider Notes (Signed)
CSN: [id number]     Arrival date & time 01/17/14  2106 History   First MD Initiated Contact with Patient 01/18/14 [phone]     Chief Complaint  Patient presents with  . Abdominal Pain     (Consider location/radiation/quality/duration/timing/severity/associated sxs/prior Treatment) HPI Comments: 28 year old female, presents from home after having approximately 36 hours of left lower quadrant pain. This is associated with nausea and vomiting but no diarrhea dysuria or vaginal discharge. She has a history of a cholecystectomy in 2014, no other significant past surgical or medical history. She states that this pain gets worse when she eats, it is a burning sensation, does not radiate.  Patient is a 28 y.o. female presenting with abdominal pain. The history is provided by the patient.  Abdominal Pain   Past Medical History  Diagnosis Date  . GERD (gastroesophageal reflux disease)   . Nausea & vomiting 10/20/2011   Past Surgical History  Procedure Laterality Date  . No past surgeries    . Cholecystectomy  10/20/2012    Procedure: LAPAROSCOPIC CHOLECYSTECTOMY WITH INTRAOPERATIVE CHOLANGIOGRAM;  Surgeon: Velora Hecklerodd M Gerkin, MD;  [facility];  Service: General;  Laterality: N/A;   No family history on file. History  Substance Use Topics  . Smoking status: Never Smoker   . Smokeless tobacco: Never Used  . Alcohol Use: No   OB History   Grav Para Term Preterm Abortions TAB SAB Ect Mult Living                 Review of Systems  Gastrointestinal: Positive for abdominal pain.  All other systems reviewed and are negative.      Allergies  Review of patient's allergies indicates no known allergies.  Home Medications   Current Outpatient Rx  Name  Route  Sig  Dispense  Refill  . HYDROcodone-acetaminophen (NORCO/VICODIN) 5-325 MG per tablet   Oral   Take 2 tablets by mouth every 4 (four) hours as needed.   10 tablet   0   . naproxen (NAPROSYN) 500 MG tablet   Oral   Take 1 tablet  (500 mg total) by mouth 2 (two) times daily with a meal.   30 tablet   0   . ondansetron (ZOFRAN ODT) 4 MG disintegrating tablet   Oral   Take 1 tablet (4 mg total) by mouth every 8 (eight) hours as needed for nausea.   10 tablet   0    BP 104/54  Pulse 65  Temp(Src) 99.1 F (37.3 C) (Oral)  Resp 17  Ht 5\' 4"  (1.626 m)  Wt 191 lb 7 oz (86.835 kg)  BMI 32.84 kg/m2  SpO2 99%  LMP 12/21/2013 Physical Exam  Nursing note and vitals reviewed. Constitutional: She appears well-developed and well-nourished. No distress.  HENT:  Head: Normocephalic and atraumatic.  Mouth/Throat: Oropharynx is clear and moist. No oropharyngeal exudate.  Eyes: Conjunctivae and EOM are normal. Pupils are equal, round, and reactive to light. Right eye exhibits no discharge. Left eye exhibits no discharge. No scleral icterus.  Neck: Normal range of motion. Neck supple. No JVD present. No thyromegaly present.  Cardiovascular: Normal rate, regular rhythm, normal heart sounds and intact distal pulses.  Exam reveals no gallop and no friction rub.   No murmur heard. Pulmonary/Chest: Effort normal and breath sounds normal. No respiratory distress. She has no wheezes. She has no rales.  Abdominal: Soft. Bowel sounds are normal. She exhibits no distension and no mass. There is tenderness ( Left lower quadrant  tenderness to palpation, no guarding, no masses, no rebound, no other tenderness including McBurney's point).  Musculoskeletal: Normal range of motion. She exhibits no edema and no tenderness.  Lymphadenopathy:    She has no cervical adenopathy.  Neurological: She is alert. Coordination normal.  Skin: Skin is warm and dry. No rash noted. No erythema.  Psychiatric: She has a normal mood and affect. Her behavior is normal.    ED Course  Procedures (including critical care time) Labs Review Labs Reviewed  CBC WITH DIFFERENTIAL - Abnormal; Notable for the following:    WBC 11.1 (*)    All other components  within normal limits  COMPREHENSIVE METABOLIC PANEL - Abnormal; Notable for the following:    Potassium 3.5 (*)    CO2 18 (*)    Glucose, Bld 136 (*)    All other components within normal limits  URINALYSIS, ROUTINE W REFLEX MICROSCOPIC - Abnormal; Notable for the following:    Color, Urine AMBER (*)    APPearance CLOUDY (*)    Bilirubin Urine SMALL (*)    Ketones, ur 15 (*)    Protein, ur 30 (*)    All other components within normal limits  URINE MICROSCOPIC-ADD ON - Abnormal; Notable for the following:    Squamous Epithelial / LPF MANY (*)    Crystals CA OXALATE CRYSTALS (*)    All other components within normal limits  PREGNANCY, URINE  POC URINE PREG, ED   Imaging Review Ct Abdomen Pelvis W Contrast  01/18/2014   CLINICAL DATA:  Left lower quadrant pain with nausea and vomiting.  EXAM: CT ABDOMEN AND PELVIS WITH CONTRAST  TECHNIQUE: Multidetector CT imaging of the abdomen and pelvis was performed using the standard protocol following bolus administration of intravenous contrast.  CONTRAST:  OMNIPAQUE IOHEXOL 300 MG/ML  SOLN  COMPARISON:  None available for comparison at time of study interpretation.  FINDINGS: Included view of the lung bases are clear with the exception of a right lower lobe sub cm calcified granulomas. Visualized heart and pericardium are unremarkable.  The liver, spleen, pancreas and adrenal glands are unremarkable. Status post cholecystectomy.  The stomach, small and large bowel are normal in course and caliber without inflammatory changes. Enteric contrast has not yet reached the distal small bowel. . Normal appendix. No intraperitoneal free fluid nor free air.  Kidneys are orthotopic, demonstrating symmetric enhancement without nephrolithiasis, hydronephrosis or renal masses. The unopacified ureters are normal in course and caliber. Urinary bladder is decompressed and unremarkable.  Great vessels are normal in course and caliber. No lymphadenopathy by CT size  criteria. Internal reproductive organs are unremarkable. The soft tissues and included osseous structures are nonsuspicious.  IMPRESSION: No acute intra-abdominal pelvic process.   Electronically Signed   By: Awilda Metro   On: 01/18/2014 06:30      MDM   Final diagnoses:  Abdominal pain    The patient has tenderness in the left lower quadrant, a slight leukocytosis of 11,100 but no other significant findings on workup including a clean urinalysis other than some calcium oxalate crystals. I would consider a possible kidney stone as her symptoms however she does not appear to have a colicky pain, her pain is somewhat reproducible lower abdomen is very soft. CT scan ordered, pain medications ordered, reevaluate.  No abdominal ttp on reexam.  CT neg for any acute findings - she is stable for d/c at this time - she was informed of her results.  Meds given in ED:  Medications  HYDROmorphone (DILAUDID) injection 1 mg (1 mg Intravenous Given 01/18/14 0445)  iohexol (OMNIPAQUE) 300 MG/ML solution 25 mL (25 mLs Oral Contrast Given 01/18/14 0410)  iohexol (OMNIPAQUE) 300 MG/ML solution 100 mL (100 mLs Intravenous Contrast Given 01/18/14 0557)    New Prescriptions   HYDROCODONE-ACETAMINOPHEN (NORCO/VICODIN) 5-325 MG PER TABLET    Take 2 tablets by mouth every 4 (four) hours as needed.   NAPROXEN (NAPROSYN) 500 MG TABLET    Take 1 tablet (500 mg total) by mouth 2 (two) times daily with a meal.   ONDANSETRON (ZOFRAN ODT) 4 MG DISINTEGRATING TABLET    Take 1 tablet (4 mg total) by mouth every 8 (eight) hours as needed for nausea.      Vida Roller, MD 01/18/14 [phone]

## 2014-09-25 IMAGING — CT CT ABD-PELV W/ CM
2 of 4 series · 16 of 46 positions shown, 18 images · IV contrast (Omni 300)
Comparison: None available for comparison at time of study
interpretation.

CLINICAL DATA: Left lower quadrant pain with nausea and vomiting.

EXAM:
CT ABDOMEN AND PELVIS WITH CONTRAST
TECHNIQUE: Multidetector CT imaging of the abdomen and pelvis was performed
using the standard protocol following bolus administration of
intravenous contrast.
CONTRAST:  100mL OMNIPAQUE IOHEXOL 300 MG/ML  SOLN

[Series 2: abd/ pelvis 5.0 i30f 1 · axial · 0.81mm/px · z∈[-518,-33]mm · 13 of 107 slices shown, 15 images]
[im 5/107  soft-tissue]
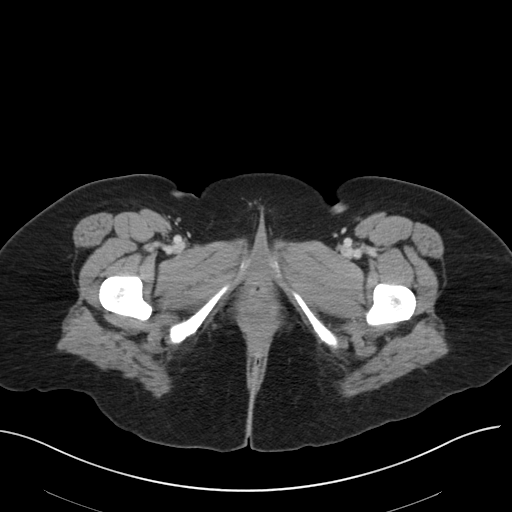
[im 5/107  bone]
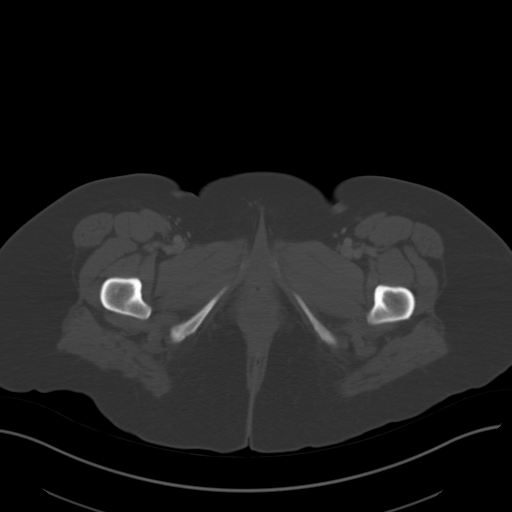
[im 14/107  soft-tissue]
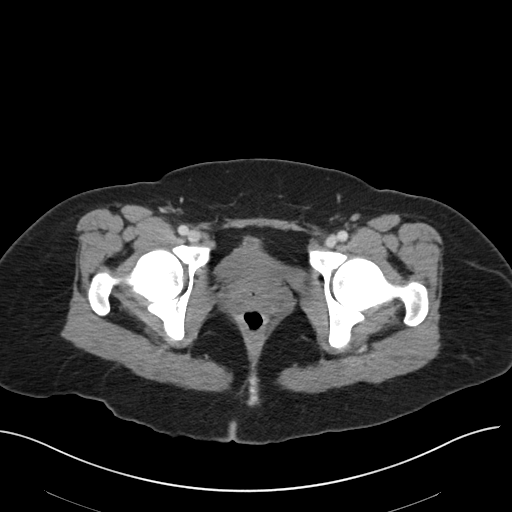
[im 23/107  soft-tissue]
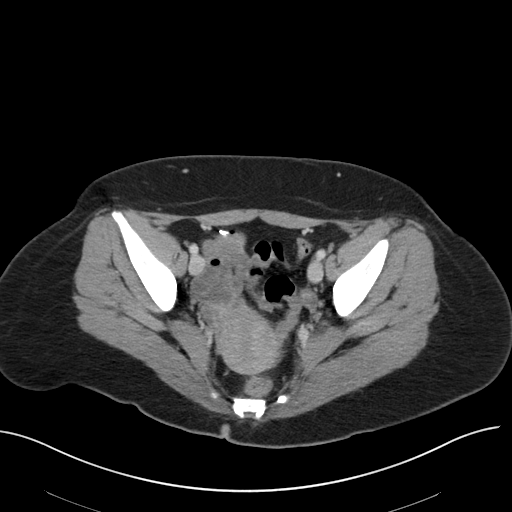
[im 31/107  soft-tissue]
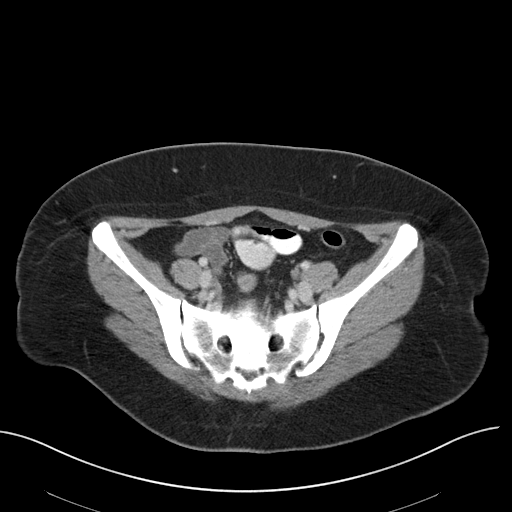
[im 36/107  soft-tissue]
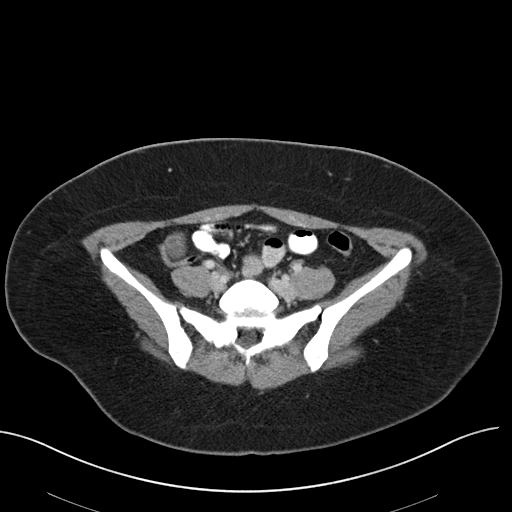
[im 45/107  soft-tissue]
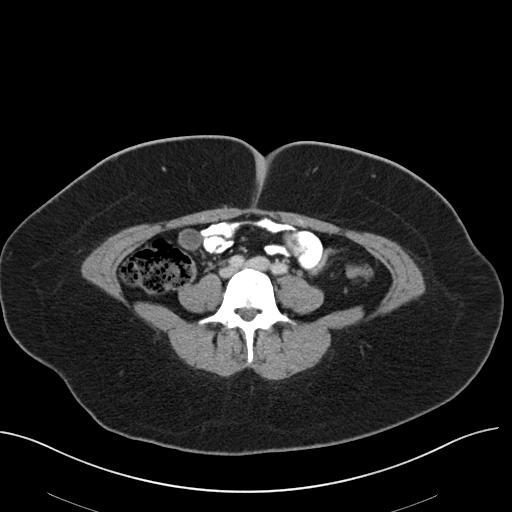
[im 54/107  soft-tissue]
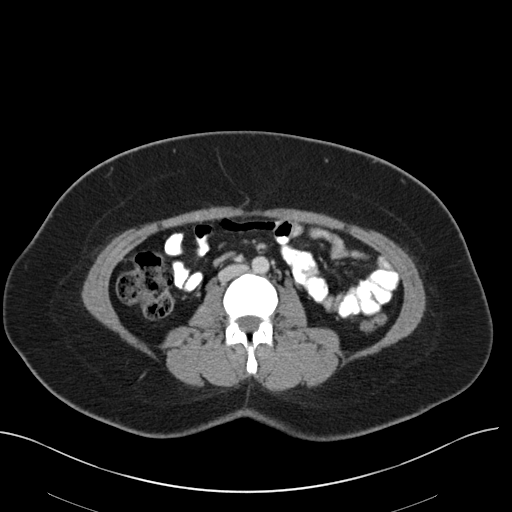
[im 62/107  soft-tissue]
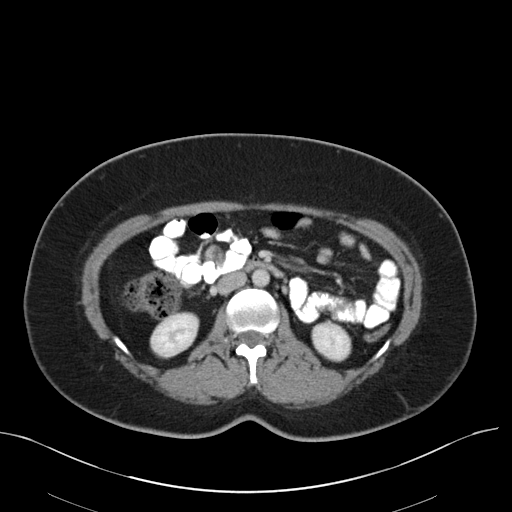
[im 71/107  soft-tissue]
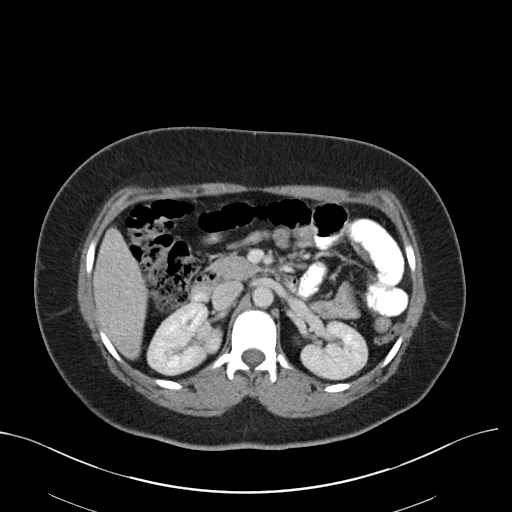
[im 71/107  bone]
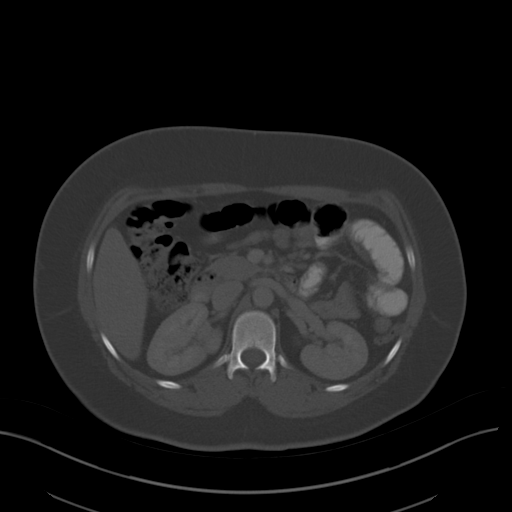
[im 76/107  soft-tissue]
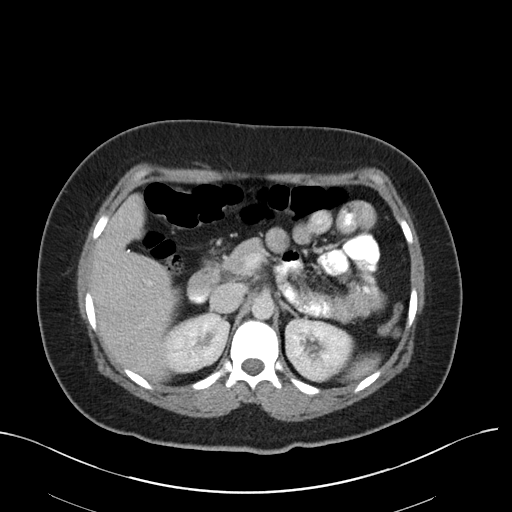
[im 84/107  soft-tissue]
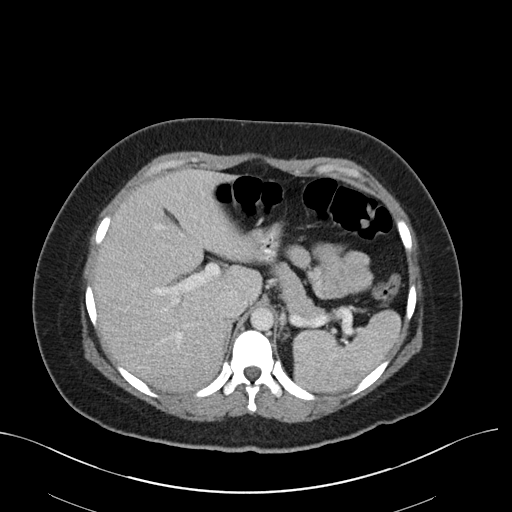
[im 93/107  soft-tissue]
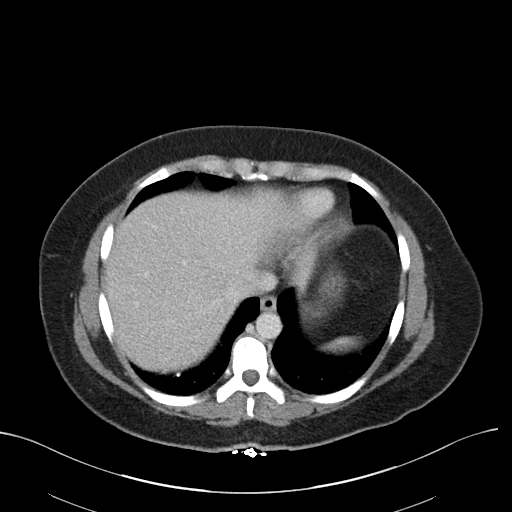
[im 102/107  soft-tissue]
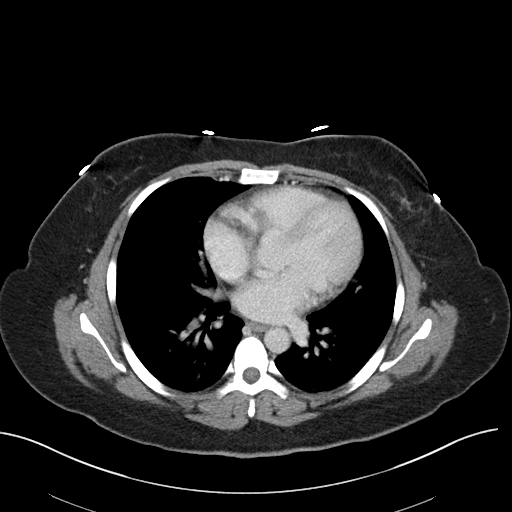

[Series 5: coronals · coronal · 0.77mm/px · 3 of 129 slices shown]
[im 43/129  soft-tissue]
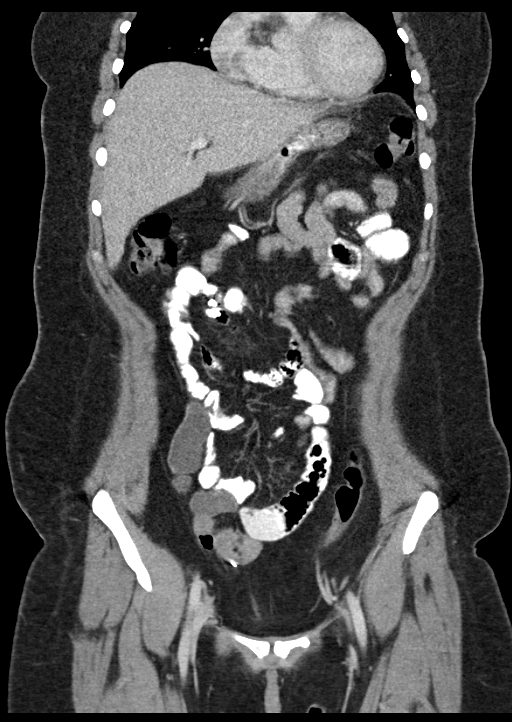
[im 57/129  soft-tissue]
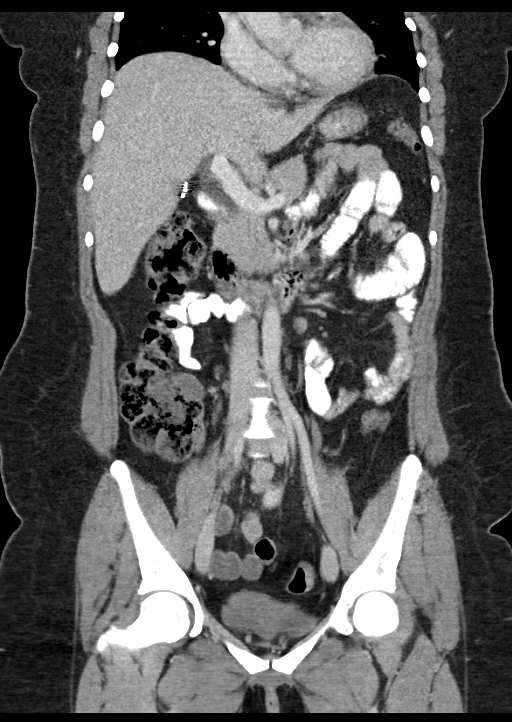
[im 72/129  soft-tissue]
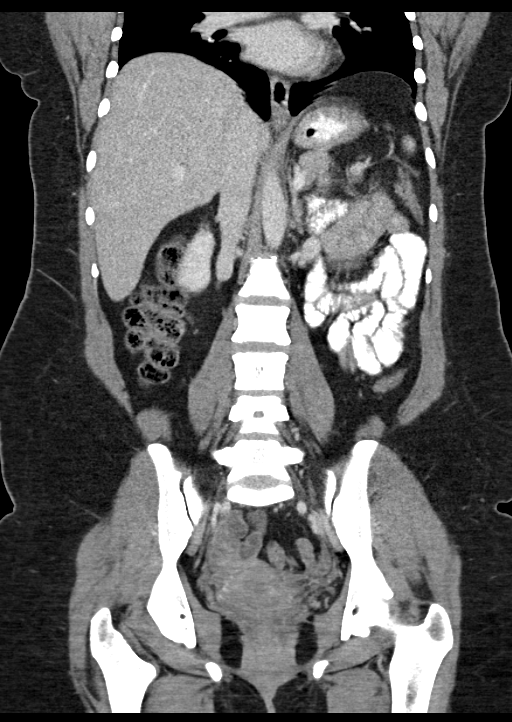

[16 of 46 positions shown; findings below may reference images not displayed]

FINDINGS: Included view of the lung bases are clear with the exception of a
right lower lobe sub cm calcified granulomas. Visualized heart and
pericardium are unremarkable.

The liver, spleen, pancreas and adrenal glands are unremarkable.
Status post cholecystectomy.

The stomach, small and large bowel are normal in course and caliber
without inflammatory changes. Enteric contrast has not yet reached
the distal small bowel. . Normal appendix. No intraperitoneal free
fluid nor free air.

Kidneys are orthotopic, demonstrating symmetric enhancement without
nephrolithiasis, hydronephrosis or renal masses. The unopacified
ureters are normal in course and caliber. Urinary bladder is
decompressed and unremarkable.

Great vessels are normal in course and caliber. No lymphadenopathy
by CT size criteria. Internal reproductive organs are unremarkable.
The soft tissues and included osseous structures are nonsuspicious.
IMPRESSION: No acute intra-abdominal pelvic process.

  By: Laaouina Tiger

## 2016-10-18 NOTE — L&D Delivery Note (Signed)
Patient is a 31 y.o. now [id number] who admitted for SROM and SOL, now s/p NSVD at [redacted]w[redacted]d  Delivery Note At 9:09 AM a viable female was delivered via Vaginal, Spontaneous Delivery (Presentation: ROA ).  APGAR: 9, 9;  Weight  Pending.  Placenta status:  Intact; to L&D  Anesthesia:  Natural Episiotomy: None Lacerations: None Suture Repair: None Est. Blood Loss (mL): 100  Head delivered ROA. No nuchal cord present. Shoulder and body delivered in usual fashion. Infant to mother's abdomen. Cord clamped x 2 after 1-minute delay, and cut by family member. Cord blood drawn. Placenta delivered spontaneously with gentle cord traction. Trailing membranes removed with ring forceps. Fundus firm with massage and Pitocin. Perineum inspected and found to have no lacerations.  Mom to postpartum.  Baby to Couplet care / Skin to Skin.  Raynelle Fanning P. Degele, MD OB Fellow 06/04/17, 9:26 AM

## 2016-11-07 ENCOUNTER — Inpatient Hospital Stay (HOSPITAL_COMMUNITY)
Admission: AD | Admit: 2016-11-07 | Discharge: 2016-11-07 | Disposition: A | Discharge status: Home / Self Care | Payer: Self-pay | Source: Ambulatory Visit | Attending: Obstetrics & Gynecology | Admitting: Obstetrics & Gynecology

## 2016-11-07 ENCOUNTER — Encounter (HOSPITAL_COMMUNITY): Payer: Self-pay | Admitting: *Deleted

## 2016-11-07 DIAGNOSIS — K219 Gastro-esophageal reflux disease without esophagitis: Secondary | ICD-10-CM | POA: Insufficient documentation

## 2016-11-07 DIAGNOSIS — Z3A14 14 weeks gestation of pregnancy: Secondary | ICD-10-CM | POA: Insufficient documentation

## 2016-11-07 DIAGNOSIS — O99612 Diseases of the digestive system complicating pregnancy, second trimester: Secondary | ICD-10-CM | POA: Insufficient documentation

## 2016-11-07 DIAGNOSIS — O23592 Infection of other part of genital tract in pregnancy, second trimester: Secondary | ICD-10-CM | POA: Insufficient documentation

## 2016-11-07 DIAGNOSIS — N760 Acute vaginitis: Secondary | ICD-10-CM | POA: Insufficient documentation

## 2016-11-07 DIAGNOSIS — B9689 Other specified bacterial agents as the cause of diseases classified elsewhere: Secondary | ICD-10-CM | POA: Insufficient documentation

## 2016-11-07 LAB — WET PREP, GENITAL
Sperm: NONE SEEN
TRICH WET PREP: NONE SEEN
Yeast Wet Prep HPF POC: NONE SEEN

## 2016-11-07 LAB — URINALYSIS, ROUTINE W REFLEX MICROSCOPIC
Bacteria, UA: NONE SEEN
Bilirubin Urine: NEGATIVE
Glucose, UA: NEGATIVE mg/dL
Hgb urine dipstick: NEGATIVE
KETONES UR: NEGATIVE mg/dL
Nitrite: NEGATIVE
PROTEIN: 30 mg/dL — AB
Specific Gravity, Urine: 1.033 — ABNORMAL HIGH (ref 1.005–1.030)
pH: 6 (ref 5.0–8.0)

## 2016-11-07 LAB — POCT PREGNANCY, URINE: Preg Test, Ur: POSITIVE — AB

## 2016-11-07 MED ORDER — METRONIDAZOLE 500 MG PO TABS: 500.0000 mg | ORAL_TABLET | Freq: Two times a day (BID) | ORAL | 0 refills | Status: DC

## 2016-11-07 MED ORDER — PREPLUS 27-1 MG PO TABS: 1.0000 | ORAL_TABLET | Freq: Every day | ORAL | 13 refills | Status: DC

## 2016-11-07 NOTE — MAU Provider Note (Signed)
  History     CSN: [id number]  Arrival date and time: 11/07/16 1611   First Provider Initiated Contact with Patient 11/07/16 1638      Chief Complaint  Patient presents with  . Abdominal Pain  . Vaginal Bleeding   HPI  31 yo G4P3003 at 372w5d by LMP presenting today for the evaluation of dysuria which has been present for the past 8 days. Patient also reports some hematuria since yesterday. She has not started prenatal care yet. She does not feel fetal movement. She is without any other complaints. Patient describes the pain as cramping pain present only at the time of urination which radiates to her lower back. She denies any chills/fevers. She denies vaginal bleeding.   Past Medical History:  Diagnosis Date  . GERD (gastroesophageal reflux disease)   . Nausea & vomiting 10/20/2011    Past Surgical History:  Procedure Laterality Date  . CHOLECYSTECTOMY  10/20/2012   Procedure: LAPAROSCOPIC CHOLECYSTECTOMY WITH INTRAOPERATIVE CHOLANGIOGRAM;  Surgeon: Velora Hecklerodd M Gerkin, MD;  [facility];  Service: General;  Laterality: N/A;    History reviewed. No pertinent family history.  Social History  Substance Use Topics  . Smoking status: Never Smoker  . Smokeless tobacco: Never Used  . Alcohol use No    Allergies: No Known Allergies  No prescriptions prior to admission.    Review of Systems  See pertinent in HPI Physical Exam   Blood pressure 111/64, pulse 78, temperature 97.2 F (36.2 C), temperature source Oral, resp. rate 18, height 5\' 1"  (1.549 m), weight 187 lb 6.4 oz (85 kg), last menstrual period 07/27/2016, SpO2 99 %.  Physical Exam GENERAL: Well-developed, well-nourished female in no acute distress.  LUNGS: Clear to auscultation bilaterally.  HEART: Regular rate and rhythm. ABDOMEN: Soft, nontender, nondistended. No organomegaly. PELVIC: Normal external female genitalia. Vagina is pink and rugated.  Normal discharge. Normal appearing cervix. Uterus is 8-weeks in  size. No adnexal mass or tenderness. BACK: no CVA tenderness EXTREMITIES: No cyanosis, clubbing, or edema, 2+ distal pulses.  MAU Course  Procedures  MDM Urine dipstick shows negative for all components, positive for leukocytes. Microscopic wet-mount exam shows clue cells. GC/Cl- pending  beside ultrasound- IUP with visualized heart beat measuring approximately 8-9 weeks  Assessment and Plan  31 yo G4P3 with early IUP and bacterial vaginosis - Results reviewed with the patient. Patient admits to irregular menses - urine culture sent. Patient will be contacted with results - Rx Flagyl provided - List of providers given to start prenatal care - Patient advised to start prenatal vitamins  Rhonda Waters 11/07/2016, 4:50 PM

## 2016-11-07 NOTE — MAU Note (Signed)
Pt present to MAU with a sharp lower abdominal pain and vaginal bleeding.  Pt states she has seen blood in her urine.

## 2016-11-07 NOTE — Discharge Instructions (Signed)
Vaginosis bacteriana (Bacterial Vaginosis) La vaginosis bacteriana es una infeccin de la vagina. Se produce cuando crece una cantidad excesiva de grmenes normales (bacterias sanas) en la vagina. Esta infeccin aumenta el riesgo de contraer otras infecciones de transmisin sexual. El tratamiento de esta infeccin puede ayudar a reducir el riesgo de otras infecciones, como:  Clamidia.  Gonorrea.  VIH.  Herpes. CUIDADOS EN EL HOGAR  Tome los medicamentos tal como se lo indic su mdico.  Finalice la prescripcin completa, aunque comience a sentirse mejor.  Comunique a sus compaeros sexuales que sufre una infeccin. Deben consultar a su mdico para iniciar un tratamiento.  Durante el tratamiento: ? Evite mantener relaciones sexuales o use preservativos de la forma correcta. ? No se haga duchas vaginales. ? No consuma alcohol a menos que el mdico lo autorice. ? No amamante a menos que el mdico la autorice.  SOLICITE AYUDA SI:  No mejora luego de 3 das de tratamiento.  Observa una secrecin (prdida) de color gris ms abundante que proviene de la vagina.  Siente ms dolor que antes.  Tiene fiebre.  ASEGRESE DE QUE:  Comprende estas instrucciones.  Controlar su afeccin.  Recibir ayuda de inmediato si no mejora o si empeora.  Esta informacin no tiene como fin reemplazar el consejo del mdico. Asegrese de hacerle al mdico cualquier pregunta que tenga. Document Released: 12/31/2008 Document Revised: 01/26/2016 Document Reviewed: 05/16/2013 Elsevier Interactive Patient Education  2017 Elsevier Inc.  

## 2016-11-08 LAB — GC/CHLAMYDIA PROBE AMP (~~LOC~~) NOT AT ARMC
CHLAMYDIA, DNA PROBE: NEGATIVE
Neisseria Gonorrhea: NEGATIVE

## 2016-11-08 LAB — CULTURE, OB URINE

## 2016-12-20 ENCOUNTER — Encounter (HOSPITAL_COMMUNITY): Payer: Self-pay | Admitting: *Deleted

## 2016-12-20 ENCOUNTER — Inpatient Hospital Stay (HOSPITAL_COMMUNITY)
Admission: AD | Admit: 2016-12-20 | Discharge: 2016-12-20 | Disposition: A | Discharge status: Home / Self Care | Payer: Self-pay | Source: Ambulatory Visit | Attending: Family Medicine | Admitting: Family Medicine

## 2016-12-20 DIAGNOSIS — N3000 Acute cystitis without hematuria: Secondary | ICD-10-CM | POA: Insufficient documentation

## 2016-12-20 LAB — URINALYSIS, ROUTINE W REFLEX MICROSCOPIC
Bilirubin Urine: NEGATIVE
GLUCOSE, UA: NEGATIVE mg/dL
Hgb urine dipstick: NEGATIVE
KETONES UR: NEGATIVE mg/dL
Nitrite: NEGATIVE
PROTEIN: NEGATIVE mg/dL
Specific Gravity, Urine: 1.017 (ref 1.005–1.030)
pH: 5 (ref 5.0–8.0)

## 2016-12-20 MED ORDER — CEPHALEXIN 500 MG PO CAPS: 500.0000 mg | ORAL_CAPSULE | Freq: Three times a day (TID) | ORAL | 0 refills | Status: AC

## 2016-12-20 NOTE — MAU Provider Note (Signed)
History     CSN: [id number]  Arrival date and time: 12/20/16 1535   First Provider Initiated Contact with Patient 12/20/16 1626      No chief complaint on file.  HPI   Interview performed with the assistance of a certified interpreter English-Spanish  30yo female w/ [phone] presents to the ED with c/o dysuria and dificulty to empty her bladder for the past 3 days. She denies fevers but states she may have had some chills. Pain on the right side of her back. Denies vaginal bleeding, vaginal dsicahrge or fluid leakage. Deies headahce, N/V. Doesn't really fell baby moving yet. Denies leg swelling. No contractions.  She had appendectomy performed on 2004.    Past Medical History:  Diagnosis Date  . GERD (gastroesophageal reflux disease)   . Nausea & vomiting 10/20/2011    Past Surgical History:  Procedure Laterality Date  . CHOLECYSTECTOMY  10/20/2012   Procedure: LAPAROSCOPIC CHOLECYSTECTOMY WITH INTRAOPERATIVE CHOLANGIOGRAM;  Surgeon: Velora Hecklerodd M Gerkin, MD;  [facility];  Service: General;  Laterality: N/A;  Appendectomy 2004  History reviewed. No pertinent family history.  Social History  Substance Use Topics  . Smoking status: Never Smoker  . Smokeless tobacco: Never Used  . Alcohol use No    Allergies:  Allergies  Allergen Reactions  . Other Rash    Sour cream    Prescriptions Prior to Admission  Medication Sig Dispense Refill Last Dose  . metroNIDAZOLE (FLAGYL) 500 MG tablet Take 1 tablet (500 mg total) by mouth 2 (two) times daily. (Patient not taking: Reported on 12/20/2016) 14 tablet 0 Not Taking at Unknown time  . Prenatal Vit-Fe Fumarate-FA (PREPLUS) 27-1 MG TABS Take 1 tablet by mouth daily. (Patient not taking: Reported on 12/20/2016) 30 tablet 13 Not Taking at Unknown time    Review of Systems  Constitutional: Positive for chills. Negative for fatigue and fever.  Respiratory: Negative for shortness of breath.   Gastrointestinal: Negative for abdominal pain.   Genitourinary: Positive for difficulty urinating, dysuria, flank pain and pelvic pain. Negative for vaginal bleeding, vaginal discharge and vaginal pain.  Skin: Negative for pallor and rash.  flank pain is very mild, right side.     Physical Exam   Blood pressure 117/74, pulse 81, temperature 98.1 F (36.7 C), resp. rate 18, height 5' (1.524 m), weight 188 lb (85.3 kg), last menstrual period 07/27/2016.  Physical Exam  Constitutional: She is oriented to person, place, and time. She appears well-developed and well-nourished.  HENT:  Head: Normocephalic and atraumatic.  Eyes: Conjunctivae and EOM are normal. Pupils are equal, round, and reactive to light.  Cardiovascular: Normal rate, regular rhythm, normal heart sounds and intact distal pulses.  Exam reveals no gallop and no friction rub.   No murmur heard. Respiratory: Effort normal and breath sounds normal. No respiratory distress. She has no wheezes.  GI: Soft. She exhibits no distension. There is tenderness. There is no rebound and no guarding.  No CVA tenderness. Some  Discomfort on palpation Tenderness on palpation of lower abdomen, no guarding, no rebound.  Neurological: She is alert and oriented to person, place, and time.  Skin: Skin is warm and dry. No rash noted. No pallor.    MAU Course  Procedures  U/A: 0-5 bacteria. haze  Assessment and Plan  Ms Derrill CenterLopez-Romero is a 30yo female [phone] who presents with UTI symptoms. This is the second ocurrency this pregnancy. Althought U/A did not show strong evidence of infection, and she is pregnant, we  will not wait for culture and will treat according to her symptoms. She can receive culture results at f/u.  She hasn't been able to start pre natal care yet, so we will schedule her for our clinic in one week where she can f/u for current infection and start prenatal care. Will defer decision on profilaxys for her obgyn, depending on culture results.   Nehemiah Settle do Silvio Clayman MD  PGY1 12/20/2016, 4:40 PM   OB FELLOW DISCHARGE ATTESTATION  I have seen and examined this patient and agree with above documentation in the resident's note.   Patient is a 31 y/o female G4P3 who presents with dysuria and suprapubic pain for three days. She does reports some chills as well.  Gen: well appearing NAD afebrile CV: reular rate, intact distal pulses Pulm: CTAB Abd: soft NT,  Gravid MSK: No CVA tenderness  A/P: Will D/C patient home with acute cystitis. Prescribed keflex 500mg  TID. Plan to follow up urine culture. Follow up with [facility]. Will nee prophylaxis as she has had two UTI's.    Ernestina Penna, MD 4:58 PM

## 2016-12-20 NOTE — MAU Note (Signed)
Pt presents to MAU with complaints of lower abdominal pain, urine frequency. Denies any VB or abnormal discharge

## 2016-12-20 NOTE — Discharge Instructions (Signed)
Embarazo e infección urinaria °(Pregnancy and Urinary Tract Infection) °¿QUÉ ES UNA INFECCIÓN URINARIA? °Una infección urinaria (IU) puede ocurrir en cualquier lugar de las vías urinarias. Estas incluyen los riñones, los tubos que conectan los riñones con la vejiga (uréteres), la vejiga y el tubo por el que se elimina la orina del cuerpo (uretra). Estos órganos fabrican, almacenan y eliminan la orina del organismo. La IU puede ser una infección de la vejiga (cistitis) o una infección de los riñones (pielonefritis). Esta infección puede deberse a hongos, virus o bacterias. Las bacterias son las causas más comunes de las IU. °Es más probable presentar una IU durante el embarazo por estas razones: °· Los cambios físicos y hormonales por los que atraviesa el cuerpo pueden hacer que sea más fácil que las bacterias ingresen en las vías urinarias. °· El feto en desarrollo hace presión sobre el útero y puede afectar el flujo de orina. °¿LA IU PONE EN RIESGO AL BEBÉ? °Una IU no tratada durante el embarazo podría ocasionar una infección en los riñones, lo que puede causar problemas de salud que afecten al bebé. Algunas de las complicaciones posibles de una IU no tratada son las siguientes: °· Tener al bebé antes de las 37 semanas de embarazo (prematuro). °· Tener un bebé con bajo peso al nacer. °· Presentar hipertensión arterial durante el embarazo (preeclampsia). °¿CUÁLES SON LOS SÍNTOMAS DE LA IU? °Entre los síntomas de una IU, se incluyen los siguientes: °· Fiebre. °· Micción frecuente o eliminación de pequeñas cantidades de orina con frecuencia. °· Necesidad urgente de orinar. °· Sensación de ardor o dolor al orinar. °· Orina con mal olor u olor atípico. °· Orina turbia. °· Dolor en la parte baja del abdomen o en la espalda. °· Dificultad para orinar. °· Sangre en la orina. °· Vómitos o más apetito de lo normal. °· Diarrea o dolor abdominal. °· Tiene secreción de flujo vaginal. °¿CUÁLES SON LAS OPCIONES DE TRATAMIENTO  PARA LA IU DURANTE EL EMBARAZO? °El tratamiento de esta afección puede incluir lo siguiente: °· Antibióticos cuyo uso es seguro durante el embarazo. °· Otros medicamentos para tratar las causas menos frecuentes de infección urinaria. °¿CÓMO PUEDO PREVENIR UNA IU? °Para prevenir la IU, haga lo siguiente: °· Vaya al baño en cuanto sienta la necesidad de hacerlo. °· Siempre debe limpiarse desde adelante hacia atrás. °· Lávese el área genital con agua tibia y jabón todos los días. °· Vaciar la vejiga antes y después de tener relaciones sexuales. °· Use ropa interior de algodón. °· Limite el consumo de alimentos y bebidas con alto contenido de azúcar, como gaseosas comunes, jugos y dulces. °· Beba de 6 a 8 vasos de agua por día. °· No use pantalones ajustados. °· No se haga duchas vaginales ni use desodorantes en aerosol. °· No tome alcohol, cafeína ni bebidas gaseosas. Estas sustancias pueden irritar la vejiga. °¿CUÁNDO DEBO BUSCAR ATENCIÓN MÉDICA? °Solicite atención médica si: °· Los síntomas no mejoran o empeoran. °· Tiene fiebre después de dos días de tratamiento. °· Tiene una erupción cutánea. °· Tiene flujo vaginal anormal. °· Siente dolor en la espalda o en el costado. °· Tiene escalofríos. °· Tiene náuseas y vómitos. °¿CUÁNDO DEBO BUSCAR ASISTENCIA MÉDICA INMEDIATA? °Solicite atención médica de inmediato si está embarazada y le sucede lo siguiente: °· Siente contracciones en el útero. °· Siente dolor en la parte inferior del abdomen. °· Tiene una pérdida de líquido por la vagina. °· Observa sangre en la orina. °· Tiene vómitos y no puede tragar medicamentos ni agua. °Esta   información no tiene como fin reemplazar el consejo del médico. Asegúrese de hacerle al médico cualquier pregunta que tenga. °Document Released: 06/28/2012 Document Revised: 01/26/2016 Document Reviewed: 08/25/2015 °Elsevier Interactive Patient Education © 2017 Elsevier Inc. ° °

## 2016-12-22 LAB — CULTURE, OB URINE: Culture: NO GROWTH

## 2016-12-27 ENCOUNTER — Ambulatory Visit: Payer: Self-pay

## 2017-01-07 ENCOUNTER — Emergency Department (HOSPITAL_COMMUNITY): Payer: Self-pay

## 2017-01-07 ENCOUNTER — Emergency Department (HOSPITAL_COMMUNITY)
Admission: EM | Admit: 2017-01-07 | Discharge: 2017-01-07 | Disposition: A | Discharge status: Home / Self Care | Payer: Self-pay | Attending: Emergency Medicine | Admitting: Emergency Medicine

## 2017-01-07 DIAGNOSIS — Z3A19 19 weeks gestation of pregnancy: Secondary | ICD-10-CM | POA: Insufficient documentation

## 2017-01-07 DIAGNOSIS — O26899 Other specified pregnancy related conditions, unspecified trimester: Secondary | ICD-10-CM

## 2017-01-07 DIAGNOSIS — O26892 Other specified pregnancy related conditions, second trimester: Secondary | ICD-10-CM | POA: Insufficient documentation

## 2017-01-07 DIAGNOSIS — R102 Pelvic and perineal pain: Secondary | ICD-10-CM

## 2017-01-07 DIAGNOSIS — R109 Unspecified abdominal pain: Secondary | ICD-10-CM

## 2017-01-07 LAB — WET PREP, GENITAL
Sperm: NONE SEEN
TRICH WET PREP: NONE SEEN
YEAST WET PREP: NONE SEEN

## 2017-01-07 LAB — URINALYSIS, ROUTINE W REFLEX MICROSCOPIC
BILIRUBIN URINE: NEGATIVE
Glucose, UA: NEGATIVE mg/dL
Hgb urine dipstick: NEGATIVE
Ketones, ur: NEGATIVE mg/dL
Leukocytes, UA: NEGATIVE
Nitrite: NEGATIVE
PROTEIN: NEGATIVE mg/dL
SPECIFIC GRAVITY, URINE: 1.014 (ref 1.005–1.030)
pH: 5 (ref 5.0–8.0)

## 2017-01-07 LAB — COMPREHENSIVE METABOLIC PANEL
ALT: 12 U/L — ABNORMAL LOW (ref 14–54)
ANION GAP: 10 (ref 5–15)
AST: 21 U/L (ref 15–41)
Albumin: 2.6 g/dL — ABNORMAL LOW (ref 3.5–5.0)
Alkaline Phosphatase: 61 U/L (ref 38–126)
BILIRUBIN TOTAL: 1 mg/dL (ref 0.3–1.2)
BUN: <5 mg/dL — ABNORMAL LOW (ref 6–20)
CO2: 19 mmol/L — ABNORMAL LOW (ref 22–32)
Calcium: 8.8 mg/dL — ABNORMAL LOW (ref 8.9–10.3)
Chloride: 106 mmol/L (ref 101–111)
Creatinine, Ser: 0.51 mg/dL (ref 0.44–1.00)
GFR calc non Af Amer: >60 mL/min (ref >60)
Glucose, Bld: 99 mg/dL (ref 65–99)
Potassium: 3.2 mmol/L — ABNORMAL LOW (ref 3.5–5.1)
Sodium: 135 mmol/L (ref 135–145)
Total Protein: 6.8 g/dL (ref 6.5–8.1)

## 2017-01-07 LAB — CBC
HCT: 34.1 % — ABNORMAL LOW (ref 36.0–46.0)
HEMOGLOBIN: 11.5 g/dL — AB (ref 12.0–15.0)
MCH: 28.4 pg (ref 26.0–34.0)
MCHC: 33.7 g/dL (ref 30.0–36.0)
MCV: 84.2 fL (ref 78.0–100.0)
PLATELETS: 287 10*3/uL (ref 150–400)
RBC: 4.05 MIL/uL (ref 3.87–5.11)
RDW: 13.7 % (ref 11.5–15.5)
WBC: 10.6 10*3/uL — ABNORMAL HIGH (ref 4.0–10.5)

## 2017-01-07 LAB — LIPASE, BLOOD: Lipase: <10 U/L — ABNORMAL LOW (ref 11–51)

## 2017-01-07 LAB — I-STAT BETA HCG BLOOD, ED (MC, WL, AP ONLY)

## 2017-01-07 MED ORDER — SODIUM CHLORIDE 0.9 % IV SOLN
INTRAVENOUS | Status: DC
  Administered 2017-01-07: 19:00:00 via INTRAVENOUS

## 2017-01-07 MED ORDER — ACETAMINOPHEN 325 MG PO TABS
650.0000 mg | ORAL_TABLET | Freq: Once | ORAL | Status: AC
  Administered 2017-01-07: 650 mg via ORAL
  Filled 2017-01-07: qty 2

## 2017-01-07 NOTE — ED Notes (Signed)
Pt stable, understands discharge instructions, and reasons for return.   

## 2017-01-07 NOTE — Discharge Instructions (Signed)
Take tylenol as needed for pain. Go to [facility] if symptoms worsen.

## 2017-01-07 NOTE — Progress Notes (Signed)
Pt seen in ED, first reported to be 23weeks, upon arrival to patients bedside, RROB informed by NP that patient is actually 19 weels per ultrasound just performed. OB cleared due to <20weeks.

## 2017-01-07 NOTE — ED Notes (Signed)
Rapid OB at bedside 

## 2017-01-07 NOTE — ED Provider Notes (Signed)
0.  [facility]-EMERGENCY DEPT Provider Note   CSN: [id number] Arrival date & time: 01/07/17  1620  By signing my name below, I, [name], attest that this documentation has been prepared under the direction and in the presence of non-physician practitioner, Kerrie Buffalo, NP. Electronically Signed: [name], Scribe. 01/07/2017. 5:54 PM.  History   Chief Complaint Chief Complaint  Patient presents with  . Abdominal Pain   The history is provided by the patient. No language interpreter was used.  Abdominal Pain   This is a new problem. The current episode started yesterday. The problem occurs constantly. The problem has not changed since onset.The pain is located in the LLQ. The pain is at a severity of 8/10. The pain is moderate. Associated symptoms include nausea, vomiting and dysuria. Pertinent negatives include fever, diarrhea, frequency and headaches. Nothing aggravates the symptoms. Nothing relieves the symptoms. Her past medical history is significant for GERD.    HPI Comments:  Rhonda Waters is a 31 y.o. [phone] at [redacted]w[redacted]d  gestation by LMP who presents to the Emergency Department complaining of constant, moderate, LLQ abdominal pain onset 23 hours ago. She describes her symptoms as an 8/10 pain that does not radiate. Pt reports associated back pain, nausea, vomiting (x4) and dysuria. She has not taken anything PTA for her pain. She was last seen in January 2018 for her prenatal care where she underwent a pelvic exam, and her next appointment is in 4 days. Pt denies any urinary frequency, diarrhea or fever. She notes that she has never had an ultrasound during her pregnancy.   Past Medical History:  Diagnosis Date  . GERD (gastroesophageal reflux disease)   . Nausea & vomiting 10/20/2011    Patient Active Problem List   Diagnosis Date Noted  . Abdominal pain, chronic, epigastric 05/17/2013  . Cholecystitis with cholelithiasis 10/20/2012  . Hx of cholelithiasis 05/03/2012   . Obesity (BMI 30-39.9) 05/03/2012  . Hx of gastroesophageal reflux (GERD) 05/03/2012    Past Surgical History:  Procedure Laterality Date  . CHOLECYSTECTOMY  10/20/2012   Procedure: LAPAROSCOPIC CHOLECYSTECTOMY WITH INTRAOPERATIVE CHOLANGIOGRAM;  Surgeon: Velora Heckler, MD;  Location: [facility] OR;  Service: General;  Laterality: N/A;    OB History    Gravida Para Term Preterm AB Living   4 3 3     3    SAB TAB Ectopic Multiple Live Births           3       Home Medications    Prior to Admission medications   Medication Sig Start Date End Date Taking? Authorizing Provider  Prenatal Vit-Fe Fumarate-FA (PREPLUS) 27-1 MG TABS Take 1 tablet by mouth daily. Patient not taking: Reported on 12/20/2016 11/07/16   Catalina Antigua, MD    Family History No family history on file.  Social History Social History  Substance Use Topics  . Smoking status: Never Smoker  . Smokeless tobacco: Never Used  . Alcohol use No     Allergies   Other   Review of Systems Review of Systems  Constitutional: Negative for chills and fever.  HENT: Negative for congestion.   Eyes: Negative for visual disturbance.  Respiratory: Negative for shortness of breath.   Gastrointestinal: Positive for abdominal pain, nausea and vomiting. Negative for diarrhea.  Genitourinary: Positive for dysuria. Negative for frequency, urgency, vaginal bleeding and vaginal discharge.  Musculoskeletal: Positive for back pain.  Skin: Negative for rash.  Neurological: Negative for headaches.     Physical Exam  Updated Vital Signs BP 107/63 (BP Location: Right Arm)   Pulse 85   Temp 97.7 F (36.5 C) (Oral)   Resp 18   LMP 07/27/2016   SpO2 99%   Physical Exam  Constitutional: She is oriented to person, place, and time. She appears well-developed and well-nourished. No distress.  HENT:  Head: Normocephalic and atraumatic.  Eyes: EOM are normal.  Neck: Neck supple.  Cardiovascular: Normal rate and regular rhythm.     Pulmonary/Chest: Effort normal and breath sounds normal.  Abdominal: Soft. She exhibits no distension. There is tenderness.  Generalized abdominal tenderness with increased pain in LLQ.   Genitourinary:  Genitourinary Comments: External genitalia without lesions, mucous d/c vaginal vault, no bleeding, cervix closed, thick. No CMT, uterus approximately 20 week size.   Musculoskeletal: Normal range of motion. She exhibits no edema.  Neurological: She is alert and oriented to person, place, and time.  Skin: Skin is warm and dry.  Psychiatric: Her behavior is normal.  Nursing note and vitals reviewed.    ED Treatments / Results  DIAGNOSTIC STUDIES: Fetal heart tones 150, Rapid response RN here to evaluate the patient.   Patient's pain improved with tylenol.  Oxygen Saturation is 98% on RA, normal by my interpretation.    COORDINATION OF CARE:  6:15 PM Discussed treatment plan with pt at bedside which includes [place] and pt agreed to plan.  Labs (all labs ordered are listed, but only abnormal results are displayed) Labs Reviewed  WET PREP, GENITAL - Abnormal; Notable for the following:       Result Value   Clue Cells Wet Prep HPF POC PRESENT (*)    WBC, Wet Prep HPF POC MANY (*)    All other components within normal limits  LIPASE, BLOOD - Abnormal; Notable for the following:    Lipase <10 (*)    All other components within normal limits  COMPREHENSIVE METABOLIC PANEL - Abnormal; Notable for the following:    Potassium 3.2 (*)    CO2 19 (*)    BUN <5 (*)    Calcium 8.8 (*)    Albumin 2.6 (*)    ALT 12 (*)    All other components within normal limits  CBC - Abnormal; Notable for the following:    WBC 10.6 (*)    Hemoglobin 11.5 (*)    HCT 34.1 (*)    All other components within normal limits  URINALYSIS, ROUTINE W REFLEX MICROSCOPIC - Abnormal; Notable for the following:    APPearance HAZY (*)    All other components within normal limits  I-STAT BETA HCG BLOOD, ED ([facility],  [facility], [facility] ONLY) - Abnormal; Notable for the following:    I-stat hCG, quantitative >2,000.0 (*)    All other components within normal limits  GC/CHLAMYDIA PROBE AMP ([place]) NOT AT [facility]    Radiology US Ob Limited  Result Date: 01/07/2017 CLINICAL DATA:  Acute left lower quadrant abdominal pain. EXAM: LIMITED OBSTETRIC ULTRASOUND FINDINGS: Number of Fetuses: 1 Heart Rate:  144 bpm Movement: Yes Presentation: Cephalic Placental Location: Posterior Previa: No Amniotic Fluid (Subjective):  Within normal limits. BPD:  4.45cm 19w  3d MATERNAL FINDINGS: Cervix:  Appears closed. Uterus/Adnexae:  No abnormality visualized. IMPRESSION: Single live intrauterine gestation of 19 weeks 3 days. This exam is performed on an emergent basis and does not comprehensively evaluate fetal size, dating, or anatomy; follow-up complete OB US should be considered if further fetal assessment is warranted. Electronically Signed   By: Fayrene Fearing  Christen ButterGreen Jr, M.D.   On: 01/07/2017 20:02    Procedures Procedures (including critical care time)  Medications Ordered in ED Medications  acetaminophen (TYLENOL) tablet 650 mg (650 mg Oral Given 01/07/17 1841)     Initial Impression / Assessment and Plan / ED Course  I have reviewed the triage vital signs and the nursing notes.  Pertinent labs & imaging results that were available during my care of the patient were reviewed by me and considered in my medical decision making (see chart for details). 31 y.o. [id number] @ 5359w4d gestation here with abdominal pain. Ultrasound shows 19 weeks 3 days. Patient is nontoxic, nonseptic appearing, in no apparent distress.  Patient's pain and other symptoms adequately managed in emergency department.  Fluid bolus given.  Labs, imaging and vitals reviewed.  Patient does not meet the SIRS or Sepsis criteria.  On repeat exam patient does not have a surgical abdomin and there are no peritoneal signs. Will treat for round ligament pain.  Patient discharged  home with symptomatic treatment and given strict instructions for follow-up with her OB as scheduled. I have also discussed reasons that would indicate patient needed  [facility]. Patient expresses understanding and agrees with plan.  Final Clinical Impressions(s) / ED Diagnoses   Final diagnoses:  Abdominal pain during pregnancy in second trimester  Pain of round ligament affecting pregnancy, antepartum    New Prescriptions Discharge Medication List as of 01/07/2017  9:42 PM    I personally performed the services described in this documentation, which was scribed in my presence. The recorded information has been reviewed and is accurate.     [address]Olsen Mccutchan New MarketM Starlet Gallentine, NP 01/08/17 0205    Jerelyn ScottMartha Linker, MD 01/08/17 [phone]

## 2017-01-07 NOTE — ED Triage Notes (Signed)
Pt reports lower abd pain since yesterday with n/v. Denies diarrhea. Reports no menstrual since Nov but has not gotten prenatal care. Denies vaginal bleeding or discharge.

## 2017-01-10 LAB — GC/CHLAMYDIA PROBE AMP (~~LOC~~) NOT AT ARMC
Chlamydia: NEGATIVE
NEISSERIA GONORRHEA: NEGATIVE

## 2017-01-11 ENCOUNTER — Encounter: Payer: Self-pay | Admitting: Advanced Practice Midwife

## 2017-01-11 ENCOUNTER — Ambulatory Visit (INDEPENDENT_AMBULATORY_CARE_PROVIDER_SITE_OTHER): Payer: Self-pay | Admitting: Advanced Practice Midwife

## 2017-01-11 VITALS — BP 108/51 | HR 82

## 2017-01-11 DIAGNOSIS — Z3480 Encounter for supervision of other normal pregnancy, unspecified trimester: Secondary | ICD-10-CM

## 2017-01-11 DIAGNOSIS — R109 Unspecified abdominal pain: Secondary | ICD-10-CM

## 2017-01-11 DIAGNOSIS — O26892 Other specified pregnancy related conditions, second trimester: Secondary | ICD-10-CM

## 2017-01-11 DIAGNOSIS — O2312 Infections of bladder in pregnancy, second trimester: Secondary | ICD-10-CM

## 2017-01-11 DIAGNOSIS — O2311 Infections of bladder in pregnancy, first trimester: Secondary | ICD-10-CM | POA: Insufficient documentation

## 2017-01-11 DIAGNOSIS — Z1151 Encounter for screening for human papillomavirus (HPV): Secondary | ICD-10-CM

## 2017-01-11 DIAGNOSIS — Z124 Encounter for screening for malignant neoplasm of cervix: Secondary | ICD-10-CM

## 2017-01-11 DIAGNOSIS — Z3490 Encounter for supervision of normal pregnancy, unspecified, unspecified trimester: Secondary | ICD-10-CM | POA: Insufficient documentation

## 2017-01-11 DIAGNOSIS — Z23 Encounter for immunization: Secondary | ICD-10-CM

## 2017-01-11 DIAGNOSIS — Z3402 Encounter for supervision of normal first pregnancy, second trimester: Secondary | ICD-10-CM

## 2017-01-11 NOTE — Progress Notes (Signed)
   PRENATAL VISIT NOTE  Subjective:  Rhonda Waters is a 31 y.o. [phone] at 3322w0d being seen today for initial prenatal visit.  She is currently monitored for the following issues for this low-risk pregnancy and has Hx of cholelithiasis; Obesity (BMI 30-39.9); Hx of gastroesophageal reflux (GERD); Cholecystitis with cholelithiasis; Abdominal pain, chronic, epigastric; Supervision of normal pregnancy; and Cystitis during pregnancy in first trimester, antepartum on her problem list.  Patient reports intermittent pain at umblicus radiating down into pelvis, intermittent dysuria.  Contractions: Not present. Vag. Bleeding: None.  Movement: Absent. Denies leaking of fluid.   The following portions of the patient's history were reviewed and updated as appropriate: allergies, current medications, past family history, past medical history, past social history, past surgical history and problem list. Problem list updated.  Objective:   Vitals:   01/11/17 0816  BP: (!) 108/51  Pulse: 82    Fetal Status: Fetal Heart Rate (bpm): 145   Movement: Absent     VS reviewed, nursing note reviewed,  Constitutional: well developed, well nourished, no distress HEENT: normocephalic CV: normal rate Pulm/chest wall: normal effort Breast Exam:  right breast normal without mass, skin or nipple changes or axillary nodes, left breast normal without mass, skin or nipple changes or axillary nodes Abdomen: soft, gravid appropriate for gestational age, fundal height at umbilicus Neuro: alert and oriented x 3 Skin: warm, dry Psych: affect normal Pelvic exam: Cervix pink, visually closed, without lesion, scant white creamy discharge, vaginal walls and external genitalia normal Bimanual exam: Cervix 0/long/high, firm, posterior  Assessment and Plan:  Pregnancy: G4P3003 at 2322w0d  1. Supervision of low-risk first pregnancy, second trimester --Pt left office without having labs drawn, RN to call and set up lab  visit.  Will offer Quad screen at that time b/c EDD adjusted based on recent Limited OB US done in ED - Prenatal Profile I - HIV antibody (with reflex) - Cytology - PAP - US MFM OB COMP + 14 WK; Future - Flu Vaccine QUAD 36+ mos IM (Fluarix, Quad PF) - Culture, OB Urine  2. Supervision of other normal pregnancy, antepartum   3. Cystitis during pregnancy in first trimester, antepartum --Pt treated x 2 during pregnancy for UTI but urine cultures both negative.  Will do U/A and OB urine culture today. Pt reports intermittent dysuria.  Consider treatment for noninfectious cystitis if persists.    4. Abdominal pain during pregnancy in second trimester --Pt with hx of chronic abdominal pain.  No visible evidence of umbilical hernia but pain c/w mild hernia.  Warning signs reviewed, pt to notify provider if pain persists/worsens. Rest/ice/heat/Tylenol for pain.  Preterm labor symptoms and general obstetric precautions including but not limited to vaginal bleeding, contractions, leaking of fluid and fetal movement were reviewed in detail with the patient. Please refer to After Visit Summary for other counseling recommendations.  No Follow-up on file.   Hurshel PartyLisa A Leftwich-Kirby, CNM

## 2017-01-11 NOTE — Progress Notes (Signed)
[employer] interpreter Dois DavenportSandra [phone]

## 2017-01-14 LAB — CYTOLOGY - PAP
Diagnosis: NEGATIVE
HPV: NOT DETECTED

## 2017-01-19 ENCOUNTER — Ambulatory Visit (HOSPITAL_COMMUNITY)
Admission: RE | Admit: 2017-01-19 | Discharge: 2017-01-19 | Disposition: A | Discharge status: Home / Self Care | Payer: Self-pay | Source: Ambulatory Visit | Attending: Advanced Practice Midwife | Admitting: Advanced Practice Midwife

## 2017-01-19 ENCOUNTER — Other Ambulatory Visit: Payer: Self-pay | Admitting: Advanced Practice Midwife

## 2017-01-19 DIAGNOSIS — O359XX0 Maternal care for (suspected) fetal abnormality and damage, unspecified, not applicable or unspecified: Secondary | ICD-10-CM

## 2017-01-19 DIAGNOSIS — Z3A20 20 weeks gestation of pregnancy: Secondary | ICD-10-CM | POA: Insufficient documentation

## 2017-01-19 DIAGNOSIS — Z3689 Encounter for other specified antenatal screening: Secondary | ICD-10-CM

## 2017-01-19 DIAGNOSIS — Z3402 Encounter for supervision of normal first pregnancy, second trimester: Secondary | ICD-10-CM | POA: Insufficient documentation

## 2017-02-01 ENCOUNTER — Encounter: Payer: Self-pay | Admitting: Advanced Practice Midwife

## 2017-02-01 DIAGNOSIS — O358XX0 Maternal care for other (suspected) fetal abnormality and damage, not applicable or unspecified: Secondary | ICD-10-CM | POA: Insufficient documentation

## 2017-02-01 DIAGNOSIS — O35EXX0 Maternal care for other (suspected) fetal abnormality and damage, fetal genitourinary anomalies, not applicable or unspecified: Secondary | ICD-10-CM | POA: Insufficient documentation

## 2017-02-08 ENCOUNTER — Telehealth: Payer: Self-pay | Admitting: *Deleted

## 2017-02-08 ENCOUNTER — Ambulatory Visit (INDEPENDENT_AMBULATORY_CARE_PROVIDER_SITE_OTHER): Payer: Self-pay | Admitting: Obstetrics & Gynecology

## 2017-02-08 VITALS — BP 98/60 | HR 80 | Wt 197.8 lb

## 2017-02-08 DIAGNOSIS — E669 Obesity, unspecified: Secondary | ICD-10-CM

## 2017-02-08 DIAGNOSIS — O35EXX0 Maternal care for other (suspected) fetal abnormality and damage, fetal genitourinary anomalies, not applicable or unspecified: Secondary | ICD-10-CM

## 2017-02-08 DIAGNOSIS — Z8719 Personal history of other diseases of the digestive system: Secondary | ICD-10-CM

## 2017-02-08 DIAGNOSIS — O358XX0 Maternal care for other (suspected) fetal abnormality and damage, not applicable or unspecified: Secondary | ICD-10-CM

## 2017-02-08 DIAGNOSIS — Z3480 Encounter for supervision of other normal pregnancy, unspecified trimester: Secondary | ICD-10-CM

## 2017-02-08 DIAGNOSIS — Z3482 Encounter for supervision of other normal pregnancy, second trimester: Secondary | ICD-10-CM

## 2017-02-08 DIAGNOSIS — O99212 Obesity complicating pregnancy, second trimester: Secondary | ICD-10-CM

## 2017-02-08 NOTE — Progress Notes (Signed)
Informal bedside US performed for pt reassurance of fetal well-being.  Fetal movement seen during scan by this RN.  Pt saw FM on [place] monitor screen and also was able to feel the movement. She felt reassured.

## 2017-02-08 NOTE — Telephone Encounter (Signed)
Opened in error

## 2017-02-08 NOTE — Progress Notes (Signed)
Pt advised to come in fasting for next visit in about 3-4 weeks.

## 2017-02-08 NOTE — Progress Notes (Signed)
   PRENATAL VISIT NOTE  Subjective:  Rhonda Waters is a 31 y.o. [phone] at [redacted]w[redacted]d being seen today for ongoing prenatal care.  She is currently monitored for the following issues for this high-risk pregnancy and has Hx of cholelithiasis; Obesity (BMI 30-39.9); Hx of gastroesophageal reflux (GERD); Cholecystitis with cholelithiasis; Abdominal pain, chronic, epigastric; Supervision of normal pregnancy; Cystitis during pregnancy in first trimester, antepartum; and Pyelectasis of fetus on prenatal ultrasound on her problem list.  Patient reports pt reports taht she has had NO fetal movement this pregnancy.  .  Contractions: Not present. Vag. Bleeding: None.  Movement: Absent. Denies leaking of fluid.   The following portions of the patient's history were reviewed and updated as appropriate: allergies, current medications, past family history, past medical history, past social history, past surgical history and problem list. Problem list updated.  Objective:   Vitals:   02/08/17 1502  BP: 98/60  Pulse: 80  Weight: 197 lb 12.8 oz (89.7 kg)    Fetal Status: Fetal Heart Rate (bpm): 150 Fundal Height: 24 cm Movement: Absent     General:  Alert, oriented and cooperative. Patient is in no acute distress.  Skin: Skin is warm and dry. No rash noted.   Cardiovascular: Normal heart rate noted  Respiratory: Normal respiratory effort, no problems with respiration noted  Abdomen: Soft, gravid, appropriate for gestational age. Pain/Pressure: Present     Pelvic:  Cervical exam deferred        Extremities: Normal range of motion.  Edema: None  Mental Status: Normal mood and affect. Normal behavior. Normal judgment and thought content.   Assessment and Plan:  Pregnancy: G4P3003 at [redacted]w[redacted]d  1. Supervision of other normal pregnancy, antepartum No fetal movement reports. Had informal beside [place] and saw baby move and was able to appreciate movement after this.  GTT and 28 weeks labs at next  visit.  2. Pyelectasis of fetus on prenatal ultrasound Reviewed with pt. She expressed understanding and understands that she will need an [place] in 6 weeks.   3. Obesity (BMI 30-39.9)  4. Hx of cholelithiasis  Preterm labor symptoms and general obstetric precautions including but not limited to vaginal bleeding, contractions, leaking of fluid and fetal movement were reviewed in detail with the patient. Please refer to After Visit Summary for other counseling recommendations.  Return in about 4 weeks (around 03/08/2017).   Willodean Rosenthal, MD

## 2017-03-08 ENCOUNTER — Ambulatory Visit (INDEPENDENT_AMBULATORY_CARE_PROVIDER_SITE_OTHER): Payer: Self-pay | Admitting: Obstetrics and Gynecology

## 2017-03-08 VITALS — BP 99/61 | HR 82 | Wt 198.8 lb

## 2017-03-08 DIAGNOSIS — Z3482 Encounter for supervision of other normal pregnancy, second trimester: Secondary | ICD-10-CM

## 2017-03-08 DIAGNOSIS — Z23 Encounter for immunization: Secondary | ICD-10-CM

## 2017-03-08 DIAGNOSIS — Z3493 Encounter for supervision of normal pregnancy, unspecified, third trimester: Secondary | ICD-10-CM

## 2017-03-08 DIAGNOSIS — Z113 Encounter for screening for infections with a predominantly sexual mode of transmission: Secondary | ICD-10-CM

## 2017-03-08 NOTE — Progress Notes (Signed)
28wk labs/tdap today Spanish video interpreter "Sherilyn CooterHenry" [phone] used for visit

## 2017-03-08 NOTE — Progress Notes (Signed)
   PRENATAL VISIT NOTE  Subjective:  Rhonda Waters is a 31 y.o. [phone] at 571w4d being seen today for ongoing prenatal care.  She is currently monitored for the following issues for this low-risk pregnancy and has Hx of cholelithiasis; Obesity (BMI 30-39.9); Hx of gastroesophageal reflux (GERD); Cholecystitis with cholelithiasis; Abdominal pain, chronic, epigastric; Supervision of normal pregnancy; Cystitis during pregnancy in first trimester, antepartum; and Pyelectasis of fetus on prenatal ultrasound on her problem list.  Patient reports no complaints.  Contractions: Not present. Vag. Bleeding: None.  Movement: Present. Denies leaking of fluid.   The following portions of the patient's history were reviewed and updated as appropriate: allergies, current medications, past family history, past medical history, past social history, past surgical history and problem list. Problem list updated.  Objective:   Vitals:   03/08/17 0849  BP: 99/61  Pulse: 82  Weight: 198 lb 12.8 oz (90.2 kg)    Fetal Status: Fetal Heart Rate (bpm): 147   Movement: Present     General:  Alert, oriented and cooperative. Patient is in no acute distress.  Skin: Skin is warm and dry. No rash noted.   Cardiovascular: Normal heart rate noted  Respiratory: Normal respiratory effort, no problems with respiration noted  Abdomen: Soft, gravid, appropriate for gestational age. Pain/Pressure: Present     Pelvic:  Cervical exam deferred        Extremities: Normal range of motion.  Edema: None  Mental Status: Normal mood and affect. Normal behavior. Normal judgment and thought content.   Assessment and Plan:  Pregnancy: G4P3003 at 111w4d  1. Encounter for supervision of normal pregnancy in third trimester, unspecified gravidity - chart reviewed and it appears initial prenatal labs were never drawn - will draw today. - Glucose Tolerance, 2 Hours w/1 Hour - CBC - HIV antibody (with reflex) - RPR - US MFM OB  FOLLOW UP; Future  2. Need for Tdap vaccination - Tdap vaccine greater than or equal to 7yo IM  3. B/L renal pylectasis -US ordered today  Preterm labor symptoms and general obstetric precautions including but not limited to vaginal bleeding, contractions, leaking of fluid and fetal movement were reviewed in detail with the patient. Please refer to After Visit Summary for other counseling recommendations.  No Follow-up on file.   Ernestina PennaNicholas Ladoris Lythgoe, MD

## 2017-03-08 NOTE — Addendum Note (Signed)
Addended by: Garret ReddishBARNES, Aidan Moten M on: 03/08/2017 09:34 AM   Modules accepted: Orders

## 2017-03-08 NOTE — Patient Instructions (Signed)
Vacuna contra la difteria, el ttanos y Herbalistla tosferina acelular (DTaP): Lo que debe saber (DTaP Vaccine (Diphtheria, Tetanus, and Pertussis): What You Need to Know) 1. Por qu vacunarse? La difteria, el ttanos y la tosferina son enfermedades graves causadas por bacterias. La difteria y la tos Benetta Sparferina se Ethiopiacontagian de persona a Social workerpersona. El ttanos ingresa al organismo a travs de cortes o heridas. La DIFTERIA produce la formacin de una membrana gruesa que cubre el fondo de la garganta.  Puede causar problemas respiratorios, parlisis, insuficiencia cardaca e incluso la muerte. El TTANOS (trismo) provoca la contraccin dolorosa de los msculos, por lo general, en todo el cuerpo.  Puede derivar en la obstruccin de la [place], lo que impide que la persona abra la boca o trague. El ttanos causa la muerte en 2de cada 10casos. La PERTUSIS (tosferina) causa episodios de tos tan intensos que los bebs tienen dificultades para comer, beber y Industrial/product designerrespirar. Estos ataques pueden durar semanas.  Puede causar neumona, convulsiones (sacudidas o la mirada perdida), dao cerebral y Hewlett Neckmuerte. La vacuna contra la difteria, el ttanos y la Programmer, applicationstosferina acelular (DTaP) puede ayudar a prevenir estas enfermedades. La mayora de los nios que reciben la vacuna DTaP estarn protegidos durante toda la infancia. Muchos nios contraeran estas enfermedades si interrumpisemos la vacunacin. La DTaP es una versin ms segura de una vacuna anterior llamada DTP. La DTP ya no se [place] en los Continental AirlinesEstados Unidos. 2. Rozann LeschesQuines y en qu momento deben recibir la vacuna DTaP? Los nios deben recibir 5dosis de la vacuna DTaP, una dosis en cada una de estas edades:  2 meses  4meses  6 meses  de 15a 18meses  de 4a 6aos La DTaP puede aplicarse en el mismo momento que otras vacunas. 3. Algunos nios no deben recibir la vacuna DTaP o deben esperar  Los nios con enfermedades menores, como un resfro, pueden vacunarse. Pero los  nios que tienen enfermedades moderadas o graves generalmente deben esperar hasta recuperarse para poder recibir la vacuna DTaP.  Un nio que ha tenido una reaccin alrgica potencialmente mortal despus de una dosis de la vacuna DTaP no debe recibir otra dosis.  Un nio que ha sufrido una enfermedad cerebral o del sistema nervioso en el transcurso de 7das de haber recibido una dosis de la vacuna DTaP no debe recibir otra dosis.  Hable con el pediatra si el nio:  tuvo una convulsin o se desmay despus de una dosis de la vacuna DTaP,  llor sin parar durante 3horas o ms despus de una dosis de la vacuna DTaP,  tuvo fiebre de ms de 105F despus de una dosis de la vacuna DTaP. Consulte a su mdico para obtener ms informacin. Algunos de estos nios no deben recibir otra dosis de la vacuna contra la tosferina, pero pueden recibir una vacuna que no la [place] Fallsincluya, [place]amada DT. 4. Nios mayores y adultos La vacuna DTaP no est autorizada para adolescentes, adultos y nios de 7aos en adelante. No obstante, las personas de estas edades necesitan proteccin. La vacuna llamada Tdap es similar a la DTaP. Se recomienda una sola dosis de Tdap para las personas de 11a [id number]. Otra vacuna llamada Td protege contra el ttanos y la difteria, pero no contra la tosferina. Se recomienda aplicarla cada 10aos. Hay diferentes hojas informativas sobre estas vacunas. 5. Cules son los riesgos de la vacuna DTaP? Enfermarse de ttanos, difteria o tosferina es mucho ms riesgoso que aplicarse la vacuna DTaP. Sin embargo, una Ferrysburgvacuna, al igual que cualquier Watervillemedicamento, [place]puede  son los riesgos de la vacuna DTaP?  Enfermarse de tétanos, difteria o tosferina es mucho más riesgoso que aplicarse la vacuna DTaP.  Sin embargo, una vacuna, al igual que cualquier medicamento, puede causar problemas serios, como reacciones alérgicas graves. El riesgo de que la vacuna DTaP cause daños graves, o la muerte, es sumamente pequeño.  Problemas leves (frecuentes)  · Fiebre (alrededor de 1 de cada 4 niños)  · Enrojecimiento o hinchazón en el lugar de la inyección (alrededor de 1 de cada 4 niños)   · Molestias o dolor con la palpación en el lugar de la inyección (alrededor de 1 de cada 4 niños)  Estos problemas son más frecuentes después de la cuarta y la quinta dosis de la serie DTaP que después de las primeras dosis. A veces, después de la cuarta o la quinta dosis de la vacuna DTaP, se hincha todo el brazo o la pierna donde se aplicó la inyección, lo que dura de 1 a 7 días (alrededor de 1 de cada 30 niños).  Entre los otros problemas leves, se incluyen los siguientes:  · Irritabilidad (alrededor de 1 de cada 3 niños)  · Cansancio o poco apetito (alrededor de 1 de cada 10 niños)  · Vómitos (alrededor de 1 de cada 50 niños)  En general, estos problemas aparecen de 1 a 3 días después de la inyección.  Problemas moderados (poco frecuentes)  · Convulsiones (sacudidas o mirada perdida) (alrededor de 1 de cada 14 000 niños)  · Llanto incesante durante 3 horas o más (alrededor de 1 de cada 1000 niños)  · Fiebre alta, superior a 105 °F (alrededor de 1 de cada 16 000 niños)  Problemas graves (muy poco frecuentes)  · Reacción alérgica grave (menos de 1 caso cada un millón de dosis)  · Se han informado otros diversos problemas graves después de la vacuna DTaP. Estos incluyen:  ? Convulsiones crónicas, coma o disminución de la conciencia.  ? Lesiones cerebrales permanentes.  Estos son tan poco frecuentes que es difícil determinar si la causa es la vacuna.  El control de la fiebre es especialmente importante en los niños que han tenido convulsiones, cualquiera sea el motivo. También es importante si otro familiar ha tenido convulsiones. Puede disminuir la fiebre y el dolor si le da al niño un analgésico sin aspirina en el momento de la aplicación y durante las 24 horas siguientes, según las indicaciones del envase.  6. ¿Qué pasa si hay una reacción grave?  ¿A qué signos debo estar atento?  · Observe todo lo que le preocupe, como signos de una reacción alérgica grave, fiebre muy alta o cambios en el comportamiento.   Los signos de una reacción alérgica grave pueden incluir ronchas, hinchazón de la cara y la garganta, dificultad para respirar, latidos cardíacos acelerados, mareos y debilidad. Pueden comenzar entre unos pocos minutos y algunas horas después de la vacunación.  ¿Qué debo hacer?  · Si usted piensa que se trata de una reacción alérgica grave o de otra emergencia que no puede esperar, llame al 911 o diríjase al hospital más cercano. Sino, llame a su médico.  · Después, la reacción debe informarse al Sistema de Información sobre Efectos Adversos de las Vacunas (Vaccine Adverse Event Reporting System, VAERS). Su médico puede presentar este informe, o puede hacerlo usted mismo a través del sitio web de VAERS, en www.vaers.hhs.gov, o llamando al 1-800-822-7967.  El VAERS es solo para informar reacciones. No brindan consejo médico.  7. Programa Nacional de Compensación de Daños por Vacunas  El Programa Nacional de Compensación de Daños por Vacunas (National Vaccine Injury Compensation Program, VICP) es un programa federal que fue creado   para compensar a las personas que puedan haber sufrido daños al recibir ciertas vacunas.  Aquellas personas que consideren que han sufrido un daño como consecuencia de una vacuna y quieren saber más acerca del programa y cómo presentar una denuncia, pueden llamar al 1-800-338-2382 o visitar su sitio web en www.hrsa.gov/vaccinecompensation.  8. ¿Cómo puedo obtener más información?  · Consulte a su médico.  · Comuníquese con el servicio de salud de su localidad o su estado.  · Comuníquese con los Centros para el Control y la Prevención de Enfermedades (Centers for Disease Control and Prevention , CDC).  ? Llame al 1-800-232-4636 (1-800-CDC-INFO), o  ? Visite la página web de los CDC en www.cdc.gov/vaccines  Declaración de información sobre la vacuna contra la difteria, el tétanos y la tosferina acelular (DTaP) de los CDC (03/03/06)   Esta información no tiene como fin reemplazar el consejo del médico. Asegúrese de hacerle al médico cualquier pregunta que tenga.  Document Released: 12/31/2008 Document Revised: 10/25/2014 Document Reviewed: 11/15/2013  Elsevier Interactive Patient Education © 2017 Elsevier Inc.

## 2017-03-09 LAB — CERVICOVAGINAL ANCILLARY ONLY
CHLAMYDIA, DNA PROBE: NEGATIVE
NEISSERIA GONORRHEA: NEGATIVE

## 2017-03-09 LAB — CBC
HEMATOCRIT: 30.8 % — AB (ref 34.0–46.6)
HEMOGLOBIN: 10.5 g/dL — AB (ref 11.1–15.9)
MCH: 27.9 pg (ref 26.6–33.0)
MCHC: 34.1 g/dL (ref 31.5–35.7)
MCV: 82 fL (ref 79–97)
Platelets: 303 10*3/uL (ref 150–379)
RBC: 3.76 x10E6/uL — ABNORMAL LOW (ref 3.77–5.28)
RDW: 14.8 % (ref 12.3–15.4)
WBC: 9.7 10*3/uL (ref 3.4–10.8)

## 2017-03-09 LAB — PRENATAL PROFILE I(LABCORP)
Antibody Screen: NEGATIVE
Basophils Absolute: 0 10*3/uL (ref 0.0–0.2)
Basos: 0 %
EOS (ABSOLUTE): 0.1 10*3/uL (ref 0.0–0.4)
EOS: 1 %
HEMOGLOBIN: 10.6 g/dL — AB (ref 11.1–15.9)
HEP B S AG: NEGATIVE
Hematocrit: 32.1 % — ABNORMAL LOW (ref 34.0–46.6)
Immature Grans (Abs): 0.1 10*3/uL (ref 0.0–0.1)
Immature Granulocytes: 1 %
LYMPHS ABS: 2.7 10*3/uL (ref 0.7–3.1)
Lymphs: 27 %
MCH: 27.5 pg (ref 26.6–33.0)
MCHC: 33 g/dL (ref 31.5–35.7)
MCV: 83 fL (ref 79–97)
Monocytes Absolute: 0.8 10*3/uL (ref 0.1–0.9)
Monocytes: 8 %
NEUTROS ABS: 6.2 10*3/uL (ref 1.4–7.0)
Neutrophils: 63 %
PLATELETS: 313 10*3/uL (ref 150–379)
RBC: 3.86 x10E6/uL (ref 3.77–5.28)
RDW: 14.5 % (ref 12.3–15.4)
RH TYPE: POSITIVE
RPR: NONREACTIVE
Rubella Antibodies, IGG: 6.75 index (ref >0.99)
WBC: 9.8 10*3/uL (ref 3.4–10.8)

## 2017-03-09 LAB — RPR: RPR: NONREACTIVE

## 2017-03-09 LAB — GLUCOSE TOLERANCE, 2 HOURS W/ 1HR
GLUCOSE, 2 HOUR: 79 mg/dL (ref 65–152)
Glucose, 1 hour: 96 mg/dL (ref 65–179)
Glucose, Fasting: 75 mg/dL (ref 65–91)

## 2017-03-09 LAB — HIV ANTIBODY (ROUTINE TESTING W REFLEX): HIV Screen 4th Generation wRfx: NONREACTIVE

## 2017-03-31 ENCOUNTER — Other Ambulatory Visit: Payer: Self-pay | Admitting: Obstetrics and Gynecology

## 2017-03-31 ENCOUNTER — Encounter: Payer: Self-pay | Admitting: Obstetrics and Gynecology

## 2017-03-31 ENCOUNTER — Ambulatory Visit (HOSPITAL_COMMUNITY)
Admission: RE | Admit: 2017-03-31 | Discharge: 2017-03-31 | Disposition: A | Discharge status: Home / Self Care | Payer: Self-pay | Source: Ambulatory Visit | Attending: Obstetrics and Gynecology | Admitting: Obstetrics and Gynecology

## 2017-03-31 DIAGNOSIS — Z3493 Encounter for supervision of normal pregnancy, unspecified, third trimester: Secondary | ICD-10-CM

## 2017-03-31 DIAGNOSIS — Z3A30 30 weeks gestation of pregnancy: Secondary | ICD-10-CM

## 2017-03-31 DIAGNOSIS — O359XX0 Maternal care for (suspected) fetal abnormality and damage, unspecified, not applicable or unspecified: Secondary | ICD-10-CM | POA: Insufficient documentation

## 2017-04-07 ENCOUNTER — Encounter: Payer: Self-pay | Admitting: Advanced Practice Midwife

## 2017-04-07 ENCOUNTER — Ambulatory Visit (INDEPENDENT_AMBULATORY_CARE_PROVIDER_SITE_OTHER): Payer: Self-pay | Admitting: Advanced Practice Midwife

## 2017-04-07 DIAGNOSIS — Z3480 Encounter for supervision of other normal pregnancy, unspecified trimester: Secondary | ICD-10-CM

## 2017-04-07 DIAGNOSIS — Z3483 Encounter for supervision of other normal pregnancy, third trimester: Secondary | ICD-10-CM

## 2017-04-07 NOTE — Patient Instructions (Signed)
Contracciones de Braxton Hicks °(Braxton Hicks Contractions) °Durante el embarazo, pueden presentarse contracciones uterinas que no siempre indican que está en trabajo de parto. °¿QUÉ SON LAS CONTRACCIONES DE BRAXTON HICKS? °Las contracciones que se presentan antes del trabajo de parto se conocen como contracciones de Braxton Hicks o falso trabajo de parto. Hacia el final del embarazo (32 a 34 semanas), estas contracciones pueden aparecen con más frecuencia y volverse más intensas. No corresponden al trabajo de parto verdadero porque estas contracciones no producen el agrandamiento (la dilatación) y el afinamiento del cuello del útero. Algunas veces, es difícil distinguirlas del trabajo de parto verdadero porque en algunos casos pueden ser muy intensas, y las personas tienen diferentes niveles de tolerancia al dolor. No debe sentirse avergonzada si concurre al hospital con falso trabajo de parto. En ocasiones, la única forma de saber si el trabajo de parto es verdadero es que el médico determine si hay cambios en el cuello del útero. °Si no hay problemas prenatales u otras complicaciones de salud asociadas con el embarazo, no habrá inconvenientes si la envían a su casa con falso trabajo de parto y espera que comience el verdadero. °CÓMO DIFERENCIAR EL TRABAJO DE PARTO FALSO DEL VERDADERO °Falso trabajo de parto  °· Las contracciones del falso trabajo de parto duran menos y no son tan intensas como las verdaderas. °· Generalmente son irregulares. °· A menudo, se sienten en la parte delantera de la parte baja del abdomen y en la ingle, °· y pueden desaparecer cuando camina o cambia de posición mientras está acostada. °· Las contracciones se vuelven más débiles y su duración es menor a medida que el tiempo transcurre. °· Por lo general, no se hacen progresivamente más intensas, regulares y cercanas entre sí como en el caso del trabajo de parto verdadero. °Verdadero trabajo de parto  °· Las contracciones del verdadero  trabajo de parto duran de 30 a 70 segundos, son muy regulares y suelen volverse más intensas, y aumenta su frecuencia. °· No desaparecen cuando camina. °· La molestia generalmente se siente en la parte superior del útero y se extiende hacia la zona inferior del abdomen y hacia la cintura. °· El médico podrá examinarla para determinar si el trabajo de parto es verdadero. El examen mostrará si el cuello del útero se está dilatando y afinando. °LO QUE DEBE RECORDAR °· Continúe haciendo los ejercicios habituales y siga otras indicaciones que el médico le dé. °· Tome todos los medicamentos como le indicó el médico. °· Concurra a las visitas prenatales regulares. °· Coma y beba con moderación si cree que está en trabajo de parto. °· Si las contracciones de Braxton Hicks le provocan incomodidad: °¨ Cambie de posición: si está acostada o descansando, camine; si está caminando, descanse. °¨ Siéntese y descanse en una bañera con agua tibia. °¨ Beba 2 o 3 vasos de agua. La deshidratación puede provocar contracciones. °¨ Respire lenta y profundamente varias veces por hora. °¿CUÁNDO DEBO BUSCAR ASISTENCIA MÉDICA INMEDIATA? °Solicite atención médica de inmediato si: °· Las contracciones se intensifican, se hacen más regulares y cercanas entre sí. °· Tiene una pérdida de líquido por la vagina. °· Tiene fiebre. °· Elimina mucosidad manchada con sangre. °· Tiene una hemorragia vaginal abundante. °· Tiene dolor abdominal permanente. °· Tiene un dolor en la zona lumbar que nunca tuvo antes. °· Siente que la cabeza del bebé empuja hacia abajo y ejerce presión en la zona pélvica. °· El bebé no se mueve tanto como solía. °Esta información no tiene como fin reemplazar el   consejo del médico. Asegúrese de hacerle al médico cualquier pregunta que tenga. °Document Released: 07/14/2005 Document Revised: 01/26/2016 Document Reviewed: 07/16/2013 °Elsevier Interactive Patient Education © 2017 Elsevier Inc. ° °

## 2017-04-07 NOTE — Progress Notes (Signed)
Video Interpreter # [id number]

## 2017-04-07 NOTE — Progress Notes (Signed)
   PRENATAL VISIT NOTE  Subjective:  Rhonda Waters is a [age] y.o. [id number] at 7561w6d being seen today for ongoing prenatal care.  She is currently monitored for the following issues for this low-risk pregnancy and has Hx of cholelithiasis; Obesity (BMI 30-39.9); Hx of gastroesophageal reflux (GERD); Cholecystitis with cholelithiasis; Abdominal pain, chronic, epigastric; Supervision of normal pregnancy; and Cystitis during pregnancy in first trimester, antepartum on her problem list.  Patient reports occasional contractions.  Contractions: Not present. Vag. Bleeding: None.  Movement: Present. Denies leaking of fluid.   The following portions of the patient's history were reviewed and updated as appropriate: allergies, current medications, past family history, past medical history, past social history, past surgical history and problem list. Problem list updated.  Objective:   Vitals:   04/07/17 1102  BP: (!) 98/58  Pulse: 77  Weight: 204 lb 1.6 oz (92.6 kg)    Fetal Status: Fetal Heart Rate (bpm): 144 Fundal Height: 33 cm Movement: Present     General:  Alert, oriented and cooperative. Patient is in no acute distress.  Skin: Skin is warm and dry. No rash noted.   Cardiovascular: Normal heart rate noted  Respiratory: Normal respiratory effort, no problems with respiration noted  Abdomen: Soft, gravid, appropriate for gestational age. Pain/Pressure: Present     Pelvic:  Cervical exam deferred        Extremities: Normal range of motion.  Edema: None  Mental Status: Normal mood and affect. Normal behavior. Normal judgment and thought content.   Assessment and Plan:  Pregnancy: [id number] at 7161w6d  1. Supervision of other normal pregnancy, antepartum   Preterm labor symptoms and general obstetric precautions including but not limited to vaginal bleeding, contractions, leaking of fluid and fetal movement were reviewed in detail with the patient. Please refer to After Visit Summary  for other counseling recommendations.  Return in 2 weeks (on 04/21/2017).   Dorathy KinsmanVirginia Tejas Seawood, CNM

## 2017-04-28 ENCOUNTER — Other Ambulatory Visit (HOSPITAL_COMMUNITY)
Admission: RE | Admit: 2017-04-28 | Discharge: 2017-04-28 | Disposition: A | Discharge status: Home / Self Care | Payer: Self-pay | Source: Ambulatory Visit | Attending: Advanced Practice Midwife | Admitting: Advanced Practice Midwife

## 2017-04-28 ENCOUNTER — Encounter: Payer: Self-pay | Admitting: Advanced Practice Midwife

## 2017-04-28 ENCOUNTER — Ambulatory Visit (INDEPENDENT_AMBULATORY_CARE_PROVIDER_SITE_OTHER): Payer: Self-pay | Admitting: Advanced Practice Midwife

## 2017-04-28 VITALS — BP 113/73 | HR 114 | Wt 205.7 lb

## 2017-04-28 DIAGNOSIS — O26893 Other specified pregnancy related conditions, third trimester: Secondary | ICD-10-CM

## 2017-04-28 DIAGNOSIS — Z3483 Encounter for supervision of other normal pregnancy, third trimester: Secondary | ICD-10-CM

## 2017-04-28 DIAGNOSIS — Z3480 Encounter for supervision of other normal pregnancy, unspecified trimester: Secondary | ICD-10-CM | POA: Insufficient documentation

## 2017-04-28 DIAGNOSIS — R102 Pelvic and perineal pain: Secondary | ICD-10-CM

## 2017-04-28 DIAGNOSIS — O4693 Antepartum hemorrhage, unspecified, third trimester: Secondary | ICD-10-CM

## 2017-04-28 DIAGNOSIS — Z113 Encounter for screening for infections with a predominantly sexual mode of transmission: Secondary | ICD-10-CM

## 2017-04-28 NOTE — Patient Instructions (Signed)
Recommend pregnancy support belt, try [employer].com or [employer].com  Tercer trimestre de Psychiatrist (Third Trimester of Pregnancy) El tercer trimestre comprende desde la semana29 hasta la semana42, es decir, desde el mes7 hasta el [address] El tercer trimestre es un perodo en el que el feto crece rpidamente. Hacia el final del noveno mes, el feto mide alrededor de 20pulgadas (45cm) de largo y pesa entre 6 y 10 libras (2,700 y 27,500kg). CAMBIOS EN EL ORGANISMO Su organismo atraviesa por muchos cambios durante el [place], y estos varan de Neomia Dear mujer a Educational psychologist.  Seguir [employer]. Es de esperar que aumente entre 25 y 35libras (11 y 16kg) hacia el final del Psychiatrist.  Podrn aparecer las primeras [employer] caderas, el abdomen y las [place].  Puede tener necesidad de Geographical information systems officer con ms frecuencia porque el feto baja hacia la pelvis y ejerce presin sobre la vejiga.  Debido al Vanetta Mulders podr sentir Anthoney Harada estomacal con frecuencia.  Puede estar estreida, ya que ciertas hormonas enlentecen los movimientos de los msculos que [employer] desechos a travs de los intestinos.  Pueden aparecer hemorroides o abultarse e hincharse las venas (venas varicosas).  Puede sentir dolor plvico debido al [employer] y a que las hormonas del Management consultant las articulaciones entre los huesos de la pelvis. El dolor de espalda puede ser consecuencia de la sobrecarga de los msculos que soportan la [place].  Tal vez haya cambios en el cabello que pueden incluir su engrosamiento, crecimiento rpido y cambios en la textura. Adems, a algunas mujeres se les cae el cabello durante o despus del embarazo, o tienen el cabello seco o fino. Lo ms probable es que el cabello se le normalice despus del nacimiento del beb.  Las [employer] seguirn creciendo y Development worker, community. A veces, puede haber una secrecin amarilla de las mamas llamada calostro.  El ombligo puede salir hacia afuera.  Puede sentir que le falta el  aire debido a que se expande el tero.  Puede notar que el feto "baja" o lo siente ms bajo, en el abdomen.  Puede tener una prdida de secrecin mucosa con sangre. Esto suele ocurrir en el trmino de unos [address] a una semana antes de que comience el [place].  El cuello del tero se vuelve delgado y blando (se borra) cerca de la fecha de [place]. QU DEBE ESPERAR EN LOS EXMENES PRENATALES Le harn exmenes prenatales cada 2semanas hasta la semana36. A partir de ese momento le harn exmenes semanales. Durante una visita prenatal de rutina:  La pesarn para asegurarse de que usted y el feto estn creciendo normalmente.  Le tomarn la presin arterial.  Le medirn el abdomen para controlar el desarrollo del beb.  Se escucharn los latidos cardacos fetales.  Se evaluarn los resultados de los estudios solicitados en visitas anteriores.  Le revisarn el cuello del tero cuando est prxima la fecha de parto para controlar si este se ha borrado. Alrededor de la semana36, el mdico le revisar el cuello del tero. Al mismo tiempo, realizar un anlisis de las secreciones del tejido vaginal. Este examen es para determinar si hay un tipo de bacteria, estreptococo Grupo B. El mdico le explicar esto con ms detalle. El mdico puede preguntarle lo siguiente:  Cmo le gustara que fuera el [place].  Cmo se siente.  Si siente los movimientos del beb.  Si ha tenido sntomas anormales, como prdida de lquido, [place], dolores de cabeza intensos o clicos abdominales.  Si est consumiendo algn producto  que contenga tabaco, como cigarrillos, tabaco de Theatre manager y Administrator, Civil Service.  Si tiene [employer]. Otros exmenes o estudios de deteccin que pueden realizarse durante el tercer trimestre incluyen lo siguiente:  Anlisis de sangre para controlar los niveles de hierro (anemia).  Controles fetales para determinar su salud, nivel de [place] y Designer, jewellery. Si  tiene [place] enfermedad o hay problemas durante el embarazo, le harn estudios.  Prueba del VIH (virus de inmunodeficiencia humana). Si corre Chiropodist, pueden realizarle una prueba de deteccin del VIH durante el tercer trimestre del embarazo. FALSO TRABAJO DE PARTO Es posible que sienta contracciones leves e irregulares que finalmente desaparecen. Se llaman contracciones de [address] o falso trabajo de [place]. Las [employer] pueden durar horas, [address] o incluso semanas, antes de que el verdadero trabajo de parto se inicie. Si las contracciones ocurren a intervalos regulares, se intensifican o se hacen dolorosas, lo mejor es que la revise el mdico. SIGNOS DE TRABAJO DE PARTO  Clicos de tipo menstrual.  Contracciones cada o menos.  Contracciones que comienzan en la parte superior del tero y se extienden hacia abajo, a la zona inferior del abdomen y la espalda.  Sensacin de mayor presin en la pelvis o dolor de espalda.  Una secrecin de mucosidad acuosa o con sangre que sale de la vagina. Si tiene alguno de estos signos antes de la semana37 del Psychiatrist, llame a su mdico de inmediato. Debe concurrir al hospital para que la controlen inmediatamente. INSTRUCCIONES PARA EL CUIDADO EN EL HOGAR  Evite fumar, consumir hierbas, beber alcohol y tomar frmacos que no le hayan recetado. Estas sustancias qumicas afectan la formacin y el desarrollo del beb.  No consuma ningn producto que contenga tabaco, lo que incluye cigarrillos, tabaco de Theatre manager y Administrator, Civil Service. Si necesita ayuda para dejar de fumar, consulte al [employer]. Puede recibir asesoramiento y otro tipo de recursos para dejar de fumar.  Siga las indicaciones del mdico en relacin con el uso de medicamentos. Durante el embarazo, hay medicamentos que son seguros de tomar y otros que no.  Haga ejercicio solamente como se lo haya indicado el mdico. Sentir clicos uterinos es un buen signo para Restaurant manager, fast food  actividad fsica.  Contine comiendo alimentos sanos con regularidad.  Use un sostn que le brinde buen soporte si le [employer].  No se d baos de inmersin en agua caliente, baos turcos ni saunas.  Use el cinturn de seguridad en todo momento mientras conduce.  No coma carne cruda ni queso sin cocinar; evite el contacto con las bandejas sanitarias de los gatos y la tierra que estos animales usan. Estos elementos contienen grmenes que pueden causar defectos congnitos en el beb.  Tome las vitaminas prenatales.  Tome entre 1500 y 2000mg  de calcio diariamente comenzando en la semana20 del embarazo [place].  Si est estreida, pruebe un laxante suave (si el mdico lo autoriza). Consuma ms alimentos ricos en fibra, como vegetales y frutas frescos y Radiation protection practitioner. Beba gran cantidad de lquido para mantener la orina de tono claro o color amarillo plido.  Dese baos de asiento con agua tibia para Engineer, materials o las molestias causadas por las hemorroides. Use una crema para las hemorroides si el mdico la autoriza.  Si tiene venas varicosas, use medias de descanso. Eleve los pies durante , 3 o 4veces por da. Limite el consumo de sal en su dieta.  Evite levantar objetos pesados, use zapatos de tacones bajos y [place]  postura.  Descanse con las piernas elevadas si tiene calambres o dolor de cintura.  Visite a su dentista si no lo ha Occupational hygienisthecho durante el embarazo. Use un cepillo de dientes blando para higienizarse los dientes y psese el hilo dental con suavidad.  Puede seguir Calpine Corporationmanteniendo relaciones sexuales, a menos que el mdico le indique lo contrario.  No haga viajes largos excepto que sea absolutamente necesario y solo con la autorizacin del mdico.  Tome clases prenatales para Financial traderentender, Education administratorpracticar y hacer preguntas sobre el [place]trabajo de parto y [place]el parto.  Haga un ensayo de la partida al hospital.  Prepare el bolso que llevar al  hospital.  Prepare la habitacin del beb.  Concurra a todas las visitas prenatales segn las indicaciones de su mdico.  SOLICITE ATENCIN MDICA SI:  No est segura de que est en trabajo de parto o de que ha roto la bolsa de las aguas.  Tiene mareos.  Siente clicos leves, presin en la pelvis o dolor persistente en el abdomen.  Tiene nuseas, vmitos o diarrea persistentes.  Brett Fairybserva una secrecin vaginal con mal olor.  Siente dolor al ConocoPhillipsorinar.  SOLICITE ATENCIN MDICA DE INMEDIATO SI:  Tiene fiebre.  Tiene una prdida de lquido por la vagina.  Tiene sangrado o pequeas prdidas vaginales.  Siente dolor intenso o clicos en el abdomen.  Sube o baja de peso rpidamente.  Tiene dificultad para respirar y siente dolor de pecho.  Sbitamente se le hinchan mucho el rostro, las [place], los tobillos, los pies o las piernas.  No ha sentido los movimientos del beb durante Georgianne Fickuna hora.  Siente un dolor de cabeza intenso que no se alivia con medicamentos.  Su visin se modifica.  Esta informacin no tiene Theme park managercomo fin reemplazar el consejo del mdico. Asegrese de hacerle al mdico cualquier pregunta que tenga. Document Released: 07/14/2005 Document Revised: 10/25/2014 Document Reviewed: 12/05/2012 Elsevier Interactive Patient Education  2017 [employer]

## 2017-04-28 NOTE — Progress Notes (Signed)
   PRENATAL VISIT NOTE  Subjective:  Rhonda Waters is a 31 y.o. [phone] at 1972w6d being seen today for ongoing prenatal care.  She is currently monitored for the following issues for this low-risk pregnancy and has Hx of cholelithiasis; Obesity (BMI 30-39.9); Hx of gastroesophageal reflux (GERD); Cholecystitis with cholelithiasis; Abdominal pain, chronic, epigastric; Supervision of normal pregnancy; and Cystitis during pregnancy in first trimester, antepartum on her problem list.  Patient reports pelvic pressure, an episode of bleeding 4 days ago that was bright red and like a light period, no bleeding since then.  Contractions: Regular. Vag. Bleeding: Moderate.  Movement: Present. Denies leaking of fluid.   The following portions of the patient's history were reviewed and updated as appropriate: allergies, current medications, past family history, past medical history, past social history, past surgical history and problem list. Problem list updated.  Objective:   Vitals:   04/28/17 1412  BP: 113/73  Pulse: (!) 114  Weight: 205 lb 11.2 oz (93.3 kg)    Fetal Status: Fetal Heart Rate (bpm): 155 Fundal Height: 34 cm Movement: Present     General:  Alert, oriented and cooperative. Patient is in no acute distress.  Skin: Skin is warm and dry. No rash noted.   Cardiovascular: Normal heart rate noted  Respiratory: Normal respiratory effort, no problems with respiration noted  Abdomen: Soft, gravid, appropriate for gestational age. Pain/Pressure: Present     Pelvic:  Cervical exam performed Dilation: Closed Effacement (%): 50 Station: -2, Pelvic exam: Cervix pink, visually closed, without lesion, scant white creamy discharge, no bleeding noted, vaginal walls and external genitalia normal   Extremities: Normal range of motion.  Edema: Trace  Mental Status: Normal mood and affect. Normal behavior. Normal judgment and thought content.   Assessment and Plan:  Pregnancy: G4P3003 at  10172w6d  1. Supervision of other normal pregnancy, antepartum  - Culture, beta strep (group b only) - GC/Chlamydia Probe Amp  2. Vaginal bleeding in pregnancy, third trimester --Single episode of bleeding 4 days ago, resolved spontaneously. Bleeding precautions reviewed. Pt to return to MAU if bleeding more than spotting occurs again.  3. Pelvic pain affecting pregnancy in third trimester, antepartum --Rest/ice/heat/warm bath/Tylenol/pregnany support belt for pain. -No evidence of preterm labor today. Precautions reviewed.   Preterm labor symptoms and general obstetric precautions including but not limited to vaginal bleeding, contractions, leaking of fluid and fetal movement were reviewed in detail with the patient. Please refer to After Visit Summary for other counseling recommendations.  No Follow-up on file.   Sharen CounterLisa Leftwich-Kirby, CNM

## 2017-05-02 LAB — CERVICOVAGINAL ANCILLARY ONLY
Chlamydia: NEGATIVE
Neisseria Gonorrhea: NEGATIVE

## 2017-05-02 NOTE — Addendum Note (Signed)
Addended by: Cheree DittoGRAHAM, DEMETRICE A on: 05/02/2017 08:46 AM   Modules accepted: Orders

## 2017-05-03 LAB — CULTURE, BETA STREP (GROUP B ONLY): Strep Gp B Culture: NEGATIVE

## 2017-05-06 ENCOUNTER — Inpatient Hospital Stay (HOSPITAL_COMMUNITY)
Admission: AD | Admit: 2017-05-06 | Discharge: 2017-05-07 | Disposition: A | Discharge status: Home / Self Care | Payer: Self-pay | Source: Ambulatory Visit | Attending: Obstetrics & Gynecology | Admitting: Obstetrics & Gynecology

## 2017-05-06 ENCOUNTER — Encounter (HOSPITAL_COMMUNITY): Payer: Self-pay

## 2017-05-06 DIAGNOSIS — Z3A36 36 weeks gestation of pregnancy: Secondary | ICD-10-CM | POA: Insufficient documentation

## 2017-05-06 DIAGNOSIS — O479 False labor, unspecified: Secondary | ICD-10-CM

## 2017-05-06 DIAGNOSIS — Z3480 Encounter for supervision of other normal pregnancy, unspecified trimester: Secondary | ICD-10-CM

## 2017-05-06 DIAGNOSIS — O26893 Other specified pregnancy related conditions, third trimester: Secondary | ICD-10-CM | POA: Insufficient documentation

## 2017-05-06 DIAGNOSIS — Z0371 Encounter for suspected problem with amniotic cavity and membrane ruled out: Secondary | ICD-10-CM

## 2017-05-06 DIAGNOSIS — O2311 Infections of bladder in pregnancy, first trimester: Secondary | ICD-10-CM

## 2017-05-06 LAB — POCT FERN TEST: POCT Fern Test: NEGATIVE

## 2017-05-06 NOTE — MAU Note (Signed)
Contractions every 5 min. They started at 3 pm.  Baby moving well. No bleeding. Mucus discharge then watery at 5am, soaked underwear and more water when walks. Pressure in pelvis also especially when urinated.

## 2017-05-06 NOTE — MAU Note (Signed)
Urine in lab 

## 2017-05-06 NOTE — MAU Note (Signed)
Pt cervix was checked by Antony Odeaaroline Neil, CNM and pt is not currently ruptured and the pt cervix was dilated 1cm.

## 2017-05-06 NOTE — MAU Provider Note (Signed)
S: [name] is a 31 y.o. [phone] at 4971w0d  who presents to MAU today complaining of leaking of fluid since 2100. She denies vaginal bleeding. She endorses contractions. She reports normal fetal movement.    O: BP 117/72 (BP Location: Right Arm)   Pulse 88   Temp 98.5 F (36.9 C) (Oral)   Resp 18   Wt 208 lb 12 oz (94.7 kg)   LMP 07/27/2016   SpO2 97%   BMI 40.77 kg/m  GENERAL: Well-developed, well-nourished female in no acute distress.  HEAD: Normocephalic, atraumatic.  CHEST: Normal effort of breathing, regular heart rate ABDOMEN: Soft, nontender, gravid PELVIC: Normal external female genitalia. Vagina is pink and rugated. Cervix with normal contour, no lesions. Normal discharge.  No pooling.   Cervical exam:  Dilation: 1 Effacement (%): Thick Cervical Position: Posterior Exam by:: C. neill SNM   Fetal Monitoring: Baseline: 140 Variability: moderate Accelerations: 15x15 Decelerations: none Contractions: irregular uc's  Results for orders placed or performed during the hospital encounter of 05/06/17 (from the past 24 hour(s))  Fern Test     Status: None   Collection Time: 05/06/17  9:54 PM  Result Value Ref Range   POCT Fern Test Negative = intact amniotic membranes    A: SIUP at 2971w0d  Membranes intact  P: Observe and recheck in 1 hour by RN  Druscilla BrownieNeill, Elisha Headlandaroline M, Student-MidWife 05/06/2017 10:36 PM  I confirm that I have verified the information documented in the nurse midwife student's note and that I have also personally reperformed the physical exam and all medical decision making activities.   Thressa ShellerHeather Ladonya Jerkins 10:52 PM 05/06/17

## 2017-05-06 NOTE — Discharge Instructions (Signed)
Contracciones de Braxton Hicks °(Braxton Hicks Contractions) °Durante el embarazo, pueden presentarse contracciones uterinas que no siempre indican que está en trabajo de parto. °¿QUÉ SON LAS CONTRACCIONES DE BRAXTON HICKS? °Las contracciones que se presentan antes del trabajo de parto se conocen como contracciones de Braxton Hicks o falso trabajo de parto. Hacia el final del embarazo (32 a 34 semanas), estas contracciones pueden aparecen con más frecuencia y volverse más intensas. No corresponden al trabajo de parto verdadero porque estas contracciones no producen el agrandamiento (la dilatación) y el afinamiento del cuello del útero. Algunas veces, es difícil distinguirlas del trabajo de parto verdadero porque en algunos casos pueden ser muy intensas, y las personas tienen diferentes niveles de tolerancia al dolor. No debe sentirse avergonzada si concurre al hospital con falso trabajo de parto. En ocasiones, la única forma de saber si el trabajo de parto es verdadero es que el médico determine si hay cambios en el cuello del útero. °Si no hay problemas prenatales u otras complicaciones de salud asociadas con el embarazo, no habrá inconvenientes si la envían a su casa con falso trabajo de parto y espera que comience el verdadero. °CÓMO DIFERENCIAR EL TRABAJO DE PARTO FALSO DEL VERDADERO °Falso trabajo de parto  °· Las contracciones del falso trabajo de parto duran menos y no son tan intensas como las verdaderas. °· Generalmente son irregulares. °· A menudo, se sienten en la parte delantera de la parte baja del abdomen y en la ingle, °· y pueden desaparecer cuando camina o cambia de posición mientras está acostada. °· Las contracciones se vuelven más débiles y su duración es menor a medida que el tiempo transcurre. °· Por lo general, no se hacen progresivamente más intensas, regulares y cercanas entre sí como en el caso del trabajo de parto verdadero. °Verdadero trabajo de parto  °· Las contracciones del verdadero  trabajo de parto duran de 30 a 70 segundos, son muy regulares y suelen volverse más intensas, y aumenta su frecuencia. °· No desaparecen cuando camina. °· La molestia generalmente se siente en la parte superior del útero y se extiende hacia la zona inferior del abdomen y hacia la cintura. °· El médico podrá examinarla para determinar si el trabajo de parto es verdadero. El examen mostrará si el cuello del útero se está dilatando y afinando. °LO QUE DEBE RECORDAR °· Continúe haciendo los ejercicios habituales y siga otras indicaciones que el médico le dé. °· Tome todos los medicamentos como le indicó el médico. °· Concurra a las visitas prenatales regulares. °· Coma y beba con moderación si cree que está en trabajo de parto. °· Si las contracciones de Braxton Hicks le provocan incomodidad: °¨ Cambie de posición: si está acostada o descansando, camine; si está caminando, descanse. °¨ Siéntese y descanse en una bañera con agua tibia. °¨ Beba 2 o 3 vasos de agua. La deshidratación puede provocar contracciones. °¨ Respire lenta y profundamente varias veces por hora. °¿CUÁNDO DEBO BUSCAR ASISTENCIA MÉDICA INMEDIATA? °Solicite atención médica de inmediato si: °· Las contracciones se intensifican, se hacen más regulares y cercanas entre sí. °· Tiene una pérdida de líquido por la vagina. °· Tiene fiebre. °· Elimina mucosidad manchada con sangre. °· Tiene una hemorragia vaginal abundante. °· Tiene dolor abdominal permanente. °· Tiene un dolor en la zona lumbar que nunca tuvo antes. °· Siente que la cabeza del bebé empuja hacia abajo y ejerce presión en la zona pélvica. °· El bebé no se mueve tanto como solía. °Esta información no tiene como fin reemplazar el   consejo del médico. Asegúrese de hacerle al médico cualquier pregunta que tenga. °Document Released: 07/14/2005 Document Revised: 01/26/2016 Document Reviewed: 07/16/2013 °Elsevier Interactive Patient Education © 2017 Elsevier Inc. ° °

## 2017-05-16 ENCOUNTER — Ambulatory Visit (INDEPENDENT_AMBULATORY_CARE_PROVIDER_SITE_OTHER): Payer: Self-pay | Admitting: Advanced Practice Midwife

## 2017-05-16 VITALS — BP 109/59 | HR 90 | Wt 207.1 lb

## 2017-05-16 DIAGNOSIS — R102 Pelvic and perineal pain: Secondary | ICD-10-CM

## 2017-05-16 DIAGNOSIS — Z3483 Encounter for supervision of other normal pregnancy, third trimester: Secondary | ICD-10-CM

## 2017-05-16 DIAGNOSIS — O26893 Other specified pregnancy related conditions, third trimester: Secondary | ICD-10-CM

## 2017-05-16 DIAGNOSIS — O368130 Decreased fetal movements, third trimester, not applicable or unspecified: Secondary | ICD-10-CM

## 2017-05-16 DIAGNOSIS — Z3480 Encounter for supervision of other normal pregnancy, unspecified trimester: Secondary | ICD-10-CM

## 2017-05-16 NOTE — Progress Notes (Signed)
Spanish interpreter utilized for visit "Rhonda Waters"

## 2017-05-16 NOTE — Patient Instructions (Signed)
Razones para volver a MAU:  1. Las contracciones son cada 5 minutos o menos, cada uno de los ltimos 1 minuto, stos han llevado a cabo durante 1-2 horas, y no se puede caminar o hablar durante ellas 2. Usted tiene un gran chorro de lquido o un goteo de lquido que no se detiene y hay que usar una toalla 3. Ha de sangrado que es de color rojo brillante, denso que el manchado - como sangrado menstrual (manchado puede ser normal en trabajo de parto prematuro o despus de una comprobacin de su cuello uterino) 4. Usted no se siente el beb se mueve como l / ella hace normalmente   Evaluacin de los movimientos fetales  (Fetal Movement Counts) Nombre del paciente: __________________________________________________ Rhonda ChapmanFecha de parto estimada: ____________________ Rhonda HammanLa evaluacin de los movimientos fetales es muy recomendable en los embarazos de alto riesgo, pero tambin es una buena idea que lo hagan todas las [place]barazadas. [place] Firefightermdico le indicar que comience a contarlos a las 28 semanas de [place]barazo. Los movimientos fetales suelen aumentar:   Despus de Animatoruna comida completa.  Despus de la actividad fsica.  Despus de comer o beber Graybar Electricalgo dulce o fro.  En reposo. Preste atencin cuando sienta que el beb est ms activo. Esto le ayudar a notar un patrn de ciclos de vigilia y sueo de su beb y cules son los factores que contribuyen a un aumento de los movimientos fetales. Es importante llevar a cabo un recuento de movimientos fetales, al mismo tiempo cada da, cuando el beb normalmente est ms activo.  CMO CONTAR LOS MOVIMIENTOS FETALES 1. Busque un lugar tranquilo y cmodo para sentarse o recostarse sobre el lado izquierdo. Al recostarse sobre su lado izquierdo, le proporciona una mejor circulacin de Byerssangre y oxgeno al beb. 2. Anote el da y la hora en una hoja de papel o en un diario. 3. Comience contando las pataditas, revoloteos, chasquidos, vueltas o pinchazos en un perodo de 2 horas.  Debe sentir al menos 10 movimientos en 2 horas. 4. Si no siente 10 movimientos en 2 horas, espere 2  3 horas y cuente de nuevo. Busque cambios en el patrn o si no cuenta lo suficiente en 2 horas. SOLICITE ATENCIN MDICA SI:   Siente menos de 10 pataditas en 2 horas, en dos intentos.  No hay movimientos durante una hora.  El patrn se modifica o le lleva ms tiempo Art gallery managercada da contar las 10 pataditas.  Siente que el beb no se mueve como lo hace habitualmente. Fecha: ____________ Movimientos: ____________ Rhonda BornHora de inicio: ____________ Rhonda BornHora de finalizacin: ____________  Rhonda NonesFecha: ____________ Movimientos: ____________ Rhonda BornHora de inicio: ____________ Rhonda BornHora de finalizacin: ____________  Rhonda NonesFecha: ____________ Movimientos: ____________ Rhonda BornHora de inicio: ____________ Rhonda BornHora de finalizacin: ____________  Rhonda NonesFecha: ____________ Movimientos: ____________ Rhonda BornHora de inicio: ____________ Rhonda BornHora de finalizacin: ____________  Rhonda NonesFecha: ____________ Movimientos: ____________ Rhonda BornHora de inicio: ____________ Rhonda RussianHora de finalizacin: ____________  Rhonda NonesFecha: ____________ Movimientos: ____________ Rhonda RussianHora de inicio: ____________ Rhonda RussianHora de finalizacin: ____________  Rhonda NonesFecha: ____________ Movimientos: ____________ Rhonda RussianHora de inicio: ____________ Rhonda RussianHora de finalizacin: ____________  Rhonda NonesFecha: ____________ Movimientos: ____________ Rhonda RussianHora de inicio: ____________ Rhonda RussianHora de finalizacin: ____________  Rhonda NonesFecha: ____________ Movimientos: ____________ Rhonda RussianHora de inicio: ____________ Rhonda RussianHora de finalizacin: ____________  Rhonda NonesFecha: ____________ Movimientos: ____________ Rhonda BornHora de inicio: ____________ Rhonda RussianHora de finalizacin: ____________  Rhonda NonesFecha: ____________ Movimientos: ____________ Rhonda BornHora de inicio: ____________ Rhonda BornHora de finalizacin: ____________  Rhonda NonesFecha: ____________ Movimientos: ____________ Rhonda BornHora de inicio: ____________ Rhonda BornHora de finalizacin: ____________  Rhonda NonesFecha: ____________ Movimientos: ____________ Rhonda BornHora de inicio: ____________ Rhonda RussianHora de finalizacin: ____________  Fecha:  ____________ Movimientos: ____________ Rhonda BornHora de inicio: ____________ Rhonda BornHora de finalizacin: ____________  Rhonda NonesFecha: ____________ Movimientos: ____________ Rhonda BornHora de inicio: ____________ Rhonda BornHora de finalizacin: ____________  Rhonda NonesFecha: ____________ Movimientos: ____________ Rhonda BornHora de inicio: ____________ Rhonda BornHora de finalizacin: ____________  Rhonda NonesFecha: ____________ Movimientos: ____________ Rhonda BornHora de inicio: ____________ Rhonda BornHora de finalizacin: ____________  Rhonda NonesFecha: ____________ Movimientos: ____________ Rhonda BornHora de inicio: ____________ Rhonda BornHora de finalizacin: ____________  Rhonda NonesFecha: ____________ Movimientos: ____________ Rhonda RussianHora de inicio: ____________ Rhonda RussianHora de finalizacin: ____________  Rhonda NonesFecha: ____________ Movimientos: ____________ Rhonda RussianHora de inicio: ____________ Rhonda RussianHora de finalizacin: ____________  Rhonda NonesFecha: ____________ Movimientos: ____________ Rhonda RussianHora de inicio: ____________ Rhonda RussianHora de finalizacin: ____________  Rhonda NonesFecha: ____________ Movimientos: ____________ Rhonda RussianHora de inicio: ____________ Rhonda RussianHora de finalizacin: ____________  Rhonda NonesFecha: ____________ Movimientos: ____________ Rhonda RussianHora de inicio: ____________ Rhonda RussianHora de finalizacin: ____________  Rhonda NonesFecha: ____________ Movimientos: ____________ Rhonda RussianHora de inicio: ____________ Rhonda RussianHora de finalizacin: ____________  Rhonda NonesFecha: ____________ Movimientos: ____________ Rhonda RussianHora de inicio: ____________ Rhonda RussianHora de finalizacin: ____________  Rhonda NonesFecha: ____________ Movimientos: ____________ Rhonda RussianHora de inicio: ____________ Rhonda RussianHora de finalizacin: ____________  Rhonda NonesFecha: ____________ Movimientos: ____________ Rhonda RussianHora de inicio: ____________ Rhonda RussianHora de finalizacin: ____________  Rhonda NonesFecha: ____________ Movimientos: ____________ Rhonda RussianHora de inicio: ____________ Rhonda RussianHora de finalizacin: ____________  Rhonda NonesFecha: ____________ Movimientos: ____________ Rhonda RussianHora de inicio: ____________ Rhonda RussianHora de finalizacin: ____________  Rhonda NonesFecha: ____________ Movimientos: ____________ Rhonda RussianHora de inicio: ____________ Rhonda RussianHora de finalizacin: ____________  Rhonda NonesFecha: ____________ Movimientos: ____________ Rhonda RussianHora  de inicio: ____________ Rhonda RussianHora de finalizacin: ____________  Rhonda NonesFecha: ____________ Movimientos: ____________ Rhonda RussianHora de inicio: ____________ Rhonda RussianHora de finalizacin: ____________  Rhonda NonesFecha: ____________ Movimientos: ____________ Rhonda RussianHora de inicio: ____________ Rhonda RussianHora de finalizacin: ____________  Rhonda NonesFecha: ____________ Movimientos: ____________ Rhonda RussianHora de inicio: ____________ Rhonda RussianHora de finalizacin: ____________  Rhonda NonesFecha: ____________ Movimientos: ____________ Rhonda RussianHora de inicio: ____________ Rhonda RussianHora de finalizacin: ____________  Rhonda NonesFecha: ____________ Movimientos: ____________ Rhonda RussianHora de inicio: ____________ Rhonda RussianHora de finalizacin: ____________  Rhonda NonesFecha: ____________ Movimientos: ____________ Rhonda RussianHora de inicio: ____________ Rhonda RussianHora de finalizacin: ____________  Rhonda NonesFecha: ____________ Movimientos: ____________ Rhonda RussianHora de inicio: ____________ Rhonda RussianHora de finalizacin: ____________  Rhonda NonesFecha: ____________ Movimientos: ____________ Rhonda RussianHora de inicio: ____________ Rhonda RussianHora de finalizacin: ____________  Rhonda NonesFecha: ____________ Movimientos: ____________ Rhonda RussianHora de inicio: ____________ Rhonda RussianHora de finalizacin: ____________  Rhonda NonesFecha: ____________ Movimientos: ____________ Rhonda RussianHora de inicio: ____________ Rhonda RussianHora de finalizacin: ____________  Rhonda NonesFecha: ____________ Movimientos: ____________ Rhonda RussianHora de inicio: ____________ Rhonda RussianHora de finalizacin: ____________  Rhonda NonesFecha: ____________ Movimientos: ____________ Rhonda RussianHora de inicio: ____________ Rhonda RussianHora de finalizacin: ____________  Rhonda NonesFecha: ____________ Movimientos: ____________ Rhonda RussianHora de inicio: ____________ Rhonda RussianHora de finalizacin: ____________  Rhonda NonesFecha: ____________ Movimientos: ____________ Rhonda RussianHora de inicio: ____________ Rhonda RussianHora de finalizacin: ____________  Rhonda NonesFecha: ____________ Movimientos: ____________ Rhonda RussianHora de inicio: ____________ Rhonda RussianHora de finalizacin: ____________  Rhonda NonesFecha: ____________ Movimientos: ____________ Rhonda RussianHora de inicio: ____________ Rhonda RussianHora de finalizacin: ____________  Rhonda NonesFecha: ____________ Movimientos: ____________ Rhonda RussianHora de inicio: ____________ Rhonda RussianHora de finalizacin:  ____________  Rhonda NonesFecha: ____________ Movimientos: ____________ Rhonda RussianHora de inicio: ____________ Rhonda RussianHora de finalizacin: ____________  Rhonda NonesFecha: ____________ Movimientos: ____________ Rhonda RussianHora de inicio: ____________ Rhonda RussianHora de finalizacin: ____________  Rhonda NonesFecha: ____________ Movimientos: ____________ Rhonda RussianHora de inicio: ____________ Rhonda RussianHora de finalizacin: ____________  Rhonda NonesFecha: ____________ Movimientos: ____________ Rhonda RussianHora de inicio: ____________ Rhonda RussianHora de finalizacin: ____________  Rhonda NonesFecha: ____________ Movimientos: ____________ Rhonda RussianHora de inicio: ____________ Rhonda RussianHora de finalizacin: ____________  Rhonda NonesFecha: ____________ Movimientos: ____________ Rhonda RussianHora de inicio: ____________ Rhonda RussianHora de finalizacin: ____________  Rhonda NonesFecha: ____________ Movimientos: ____________ Rhonda RussianHora de inicio: ____________ Rhonda RussianHora de finalizacin: ____________  Rhonda NonesFecha: ____________ Movimientos: ____________ Rhonda RussianHora de inicio: ____________ Rhonda RussianHora de finalizacin: ____________  Esta informacin no tiene como fin reemplazar el consejo del mdico. Asegrese de hacerle al mdico cualquier pregunta que tenga. Document Released: 01/11/2008 Document Revised: 09/20/2012 Elsevier Interactive Patient Education  2017 [employer]

## 2017-05-16 NOTE — Progress Notes (Signed)
Pt felt good FM during NST and FHR was reactive.  Pt advised to return to hospital if decreased fetal movement occurs again. She voiced understanding. Video interpreter Marlon # [phone] used for encounter.

## 2017-05-16 NOTE — Progress Notes (Signed)
   PRENATAL VISIT NOTE  Subjective:  Rhonda Waters is a 31 y.o. [phone] at 38108w3d being seen today for ongoing prenatal care.  She is currently monitored for the following issues for this low-risk pregnancy and has Hx of cholelithiasis; Obesity (BMI 30-39.9); Hx of gastroesophageal reflux (GERD); Cholecystitis with cholelithiasis; Abdominal pain, chronic, epigastric; Supervision of normal pregnancy; and Cystitis during pregnancy in first trimester, antepartum on her problem list.  Patient reports Decreased fetal movement with 1 movement in 24 hours; pelvic pressure and pain with intermittent cramping/contractions.  Contractions: Irregular. Vag. Bleeding: None.  Movement: (!) Decreased. Denies leaking of fluid.   The following portions of the patient's history were reviewed and updated as appropriate: allergies, current medications, past family history, past medical history, past social history, past surgical history and problem list. Problem list updated.  Objective:   Vitals:   05/16/17 1436  BP: (!) 109/59  Pulse: 90  Weight: 207 lb 1.6 oz (93.9 kg)    Fetal Status: Fetal Heart Rate (bpm): 152 Fundal Height: 37 cm Movement: (!) Decreased     General:  Alert, oriented and cooperative. Patient is in no acute distress.  Skin: Skin is warm and dry. No rash noted.   Cardiovascular: Normal heart rate noted  Respiratory: Normal respiratory effort, no problems with respiration noted  Abdomen: Soft, gravid, appropriate for gestational age.  Pain/Pressure: Present     Pelvic: Cervical exam performed Dilation: 2 Effacement (%): 50 Station: -2  Extremities: Normal range of motion.  Edema: None  Mental Status:  Normal mood and affect. Normal behavior. Normal judgment and thought content.   Assessment and Plan:  Pregnancy: G4P3003 at 2108w3d  1. Supervision of other normal pregnancy, antepartum   2. Pelvic pain affecting pregnancy in third trimester, antepartum --Cervix 2/50/-2,  changed from MAU 10 days ago at 1/thick but c/w term multiparity.  Labor precautions reviewed.  3. Decreased fetal movements in third trimester, single or unspecified fetus --NST in office today--reactive. --Fetal kick/movement counting reviewed  Term labor symptoms and general obstetric precautions including but not limited to vaginal bleeding, contractions, leaking of fluid and fetal movement were reviewed in detail with the patient. Please refer to After Visit Summary for other counseling recommendations.  Return in about 1 week (around 05/23/2017).   Sharen CounterLisa Leftwich-Kirby, CNM

## 2017-05-22 ENCOUNTER — Inpatient Hospital Stay (HOSPITAL_COMMUNITY)
Admission: AD | Admit: 2017-05-22 | Discharge: 2017-05-22 | Disposition: A | Discharge status: Home / Self Care | Payer: Self-pay | Source: Ambulatory Visit | Attending: Obstetrics & Gynecology | Admitting: Obstetrics & Gynecology

## 2017-05-22 ENCOUNTER — Encounter (HOSPITAL_COMMUNITY): Payer: Self-pay

## 2017-05-22 DIAGNOSIS — O471 False labor at or after 37 completed weeks of gestation: Secondary | ICD-10-CM | POA: Insufficient documentation

## 2017-05-22 DIAGNOSIS — O479 False labor, unspecified: Secondary | ICD-10-CM

## 2017-05-22 DIAGNOSIS — Z3A39 39 weeks gestation of pregnancy: Secondary | ICD-10-CM | POA: Insufficient documentation

## 2017-05-22 NOTE — MAU Note (Signed)
I have communicated with Kandee Keenory, the resident/H Mathews RobinsonsHogan, CNM and reviewed vital signs:  Vitals:   05/22/17 1940  BP: 110/72  Pulse: 86  Resp: 19  Temp: (!) 97.5 F (36.4 C)    Vaginal exam:  Dilation: 1 Effacement (%): 50 Cervical Position: Posterior Station: -3 Presentation: Undeterminable Exam by:: [place] Companymber Khamauri Bauernfeind RN,   Also reviewed contraction pattern and that non-stress test is reactive.  It has been documented that patient is contracting occasionally (patient reports only every 20 minutes) with cervix 1/50/-3, not indicating active labor.  Patient denies any other complaints.  Based on this report provider has given order for discharge.  A discharge order and diagnosis entered by a provider.   Labor discharge instructions reviewed with patient.

## 2017-05-22 NOTE — MAU Note (Signed)
Pt reports contractions every 20 mins. Pt denies LOF but has some bloody mucous. Reports good fetal movement. Was closed on last exam.

## 2017-05-22 NOTE — Discharge Instructions (Signed)
Contracciones de Braxton Hicks °(Braxton Hicks Contractions) °Durante el embarazo, pueden presentarse contracciones uterinas que no siempre indican que está en trabajo de parto. °¿QUÉ SON LAS CONTRACCIONES DE BRAXTON HICKS? °Las contracciones que se presentan antes del trabajo de parto se conocen como contracciones de Braxton Hicks o falso trabajo de parto. Hacia el final del embarazo (32 a 34 semanas), estas contracciones pueden aparecen con más frecuencia y volverse más intensas. No corresponden al trabajo de parto verdadero porque estas contracciones no producen el agrandamiento (la dilatación) y el afinamiento del cuello del útero. Algunas veces, es difícil distinguirlas del trabajo de parto verdadero porque en algunos casos pueden ser muy intensas, y las personas tienen diferentes niveles de tolerancia al dolor. No debe sentirse avergonzada si concurre al hospital con falso trabajo de parto. En ocasiones, la única forma de saber si el trabajo de parto es verdadero es que el médico determine si hay cambios en el cuello del útero. °Si no hay problemas prenatales u otras complicaciones de salud asociadas con el embarazo, no habrá inconvenientes si la envían a su casa con falso trabajo de parto y espera que comience el verdadero. °CÓMO DIFERENCIAR EL TRABAJO DE PARTO FALSO DEL VERDADERO °Falso trabajo de parto  °· Las contracciones del falso trabajo de parto duran menos y no son tan intensas como las verdaderas. °· Generalmente son irregulares. °· A menudo, se sienten en la parte delantera de la parte baja del abdomen y en la ingle, °· y pueden desaparecer cuando camina o cambia de posición mientras está acostada. °· Las contracciones se vuelven más débiles y su duración es menor a medida que el tiempo transcurre. °· Por lo general, no se hacen progresivamente más intensas, regulares y cercanas entre sí como en el caso del trabajo de parto verdadero. °Verdadero trabajo de parto  °· Las contracciones del verdadero  trabajo de parto duran de 30 a 70 segundos, son muy regulares y suelen volverse más intensas, y aumenta su frecuencia. °· No desaparecen cuando camina. °· La molestia generalmente se siente en la parte superior del útero y se extiende hacia la zona inferior del abdomen y hacia la cintura. °· El médico podrá examinarla para determinar si el trabajo de parto es verdadero. El examen mostrará si el cuello del útero se está dilatando y afinando. °LO QUE DEBE RECORDAR °· Continúe haciendo los ejercicios habituales y siga otras indicaciones que el médico le dé. °· Tome todos los medicamentos como le indicó el médico. °· Concurra a las visitas prenatales regulares. °· Coma y beba con moderación si cree que está en trabajo de parto. °· Si las contracciones de Braxton Hicks le provocan incomodidad: °¨ Cambie de posición: si está acostada o descansando, camine; si está caminando, descanse. °¨ Siéntese y descanse en una bañera con agua tibia. °¨ Beba 2 o 3 vasos de agua. La deshidratación puede provocar contracciones. °¨ Respire lenta y profundamente varias veces por hora. °¿CUÁNDO DEBO BUSCAR ASISTENCIA MÉDICA INMEDIATA? °Solicite atención médica de inmediato si: °· Las contracciones se intensifican, se hacen más regulares y cercanas entre sí. °· Tiene una pérdida de líquido por la vagina. °· Tiene fiebre. °· Elimina mucosidad manchada con sangre. °· Tiene una hemorragia vaginal abundante. °· Tiene dolor abdominal permanente. °· Tiene un dolor en la zona lumbar que nunca tuvo antes. °· Siente que la cabeza del bebé empuja hacia abajo y ejerce presión en la zona pélvica. °· El bebé no se mueve tanto como solía. °Esta información no tiene como fin reemplazar el   consejo del médico. Asegúrese de hacerle al médico cualquier pregunta que tenga. °Document Released: 07/14/2005 Document Revised: 01/26/2016 Document Reviewed: 07/16/2013 °Elsevier Interactive Patient Education © 2017 Elsevier Inc. ° °

## 2017-05-23 ENCOUNTER — Ambulatory Visit (INDEPENDENT_AMBULATORY_CARE_PROVIDER_SITE_OTHER): Payer: Self-pay | Admitting: Student

## 2017-05-23 VITALS — BP 108/58 | HR 88 | Wt 206.7 lb

## 2017-05-23 DIAGNOSIS — R350 Frequency of micturition: Secondary | ICD-10-CM

## 2017-05-23 DIAGNOSIS — Z3483 Encounter for supervision of other normal pregnancy, third trimester: Secondary | ICD-10-CM

## 2017-05-23 DIAGNOSIS — O2313 Infections of bladder in pregnancy, third trimester: Secondary | ICD-10-CM

## 2017-05-23 DIAGNOSIS — O2311 Infections of bladder in pregnancy, first trimester: Secondary | ICD-10-CM

## 2017-05-23 NOTE — Progress Notes (Signed)
Video Interpreter # [id number] Vaginal discharge clear with no odor

## 2017-05-23 NOTE — Patient Instructions (Signed)
Contracciones de Braxton Hicks °(Braxton Hicks Contractions) °Durante el embarazo, pueden presentarse contracciones uterinas que no siempre indican que está en trabajo de parto. °¿QUÉ SON LAS CONTRACCIONES DE BRAXTON HICKS? °Las contracciones que se presentan antes del trabajo de parto se conocen como contracciones de Braxton Hicks o falso trabajo de parto. Hacia el final del embarazo (32 a 34 semanas), estas contracciones pueden aparecen con más frecuencia y volverse más intensas. No corresponden al trabajo de parto verdadero porque estas contracciones no producen el agrandamiento (la dilatación) y el afinamiento del cuello del útero. Algunas veces, es difícil distinguirlas del trabajo de parto verdadero porque en algunos casos pueden ser muy intensas, y las personas tienen diferentes niveles de tolerancia al dolor. No debe sentirse avergonzada si concurre al hospital con falso trabajo de parto. En ocasiones, la única forma de saber si el trabajo de parto es verdadero es que el médico determine si hay cambios en el cuello del útero. °Si no hay problemas prenatales u otras complicaciones de salud asociadas con el embarazo, no habrá inconvenientes si la envían a su casa con falso trabajo de parto y espera que comience el verdadero. °CÓMO DIFERENCIAR EL TRABAJO DE PARTO FALSO DEL VERDADERO °Falso trabajo de parto  °· Las contracciones del falso trabajo de parto duran menos y no son tan intensas como las verdaderas. °· Generalmente son irregulares. °· A menudo, se sienten en la parte delantera de la parte baja del abdomen y en la ingle, °· y pueden desaparecer cuando camina o cambia de posición mientras está acostada. °· Las contracciones se vuelven más débiles y su duración es menor a medida que el tiempo transcurre. °· Por lo general, no se hacen progresivamente más intensas, regulares y cercanas entre sí como en el caso del trabajo de parto verdadero. °Verdadero trabajo de parto  °· Las contracciones del verdadero  trabajo de parto duran de 30 a 70 segundos, son muy regulares y suelen volverse más intensas, y aumenta su frecuencia. °· No desaparecen cuando camina. °· La molestia generalmente se siente en la parte superior del útero y se extiende hacia la zona inferior del abdomen y hacia la cintura. °· El médico podrá examinarla para determinar si el trabajo de parto es verdadero. El examen mostrará si el cuello del útero se está dilatando y afinando. °LO QUE DEBE RECORDAR °· Continúe haciendo los ejercicios habituales y siga otras indicaciones que el médico le dé. °· Tome todos los medicamentos como le indicó el médico. °· Concurra a las visitas prenatales regulares. °· Coma y beba con moderación si cree que está en trabajo de parto. °· Si las contracciones de Braxton Hicks le provocan incomodidad: °¨ Cambie de posición: si está acostada o descansando, camine; si está caminando, descanse. °¨ Siéntese y descanse en una bañera con agua tibia. °¨ Beba 2 o 3 vasos de agua. La deshidratación puede provocar contracciones. °¨ Respire lenta y profundamente varias veces por hora. °¿CUÁNDO DEBO BUSCAR ASISTENCIA MÉDICA INMEDIATA? °Solicite atención médica de inmediato si: °· Las contracciones se intensifican, se hacen más regulares y cercanas entre sí. °· Tiene una pérdida de líquido por la vagina. °· Tiene fiebre. °· Elimina mucosidad manchada con sangre. °· Tiene una hemorragia vaginal abundante. °· Tiene dolor abdominal permanente. °· Tiene un dolor en la zona lumbar que nunca tuvo antes. °· Siente que la cabeza del bebé empuja hacia abajo y ejerce presión en la zona pélvica. °· El bebé no se mueve tanto como solía. °Esta información no tiene como fin reemplazar el   consejo del médico. Asegúrese de hacerle al médico cualquier pregunta que tenga. °Document Released: 07/14/2005 Document Revised: 01/26/2016 Document Reviewed: 07/16/2013 °Elsevier Interactive Patient Education © 2017 Elsevier Inc. ° °

## 2017-05-24 NOTE — Progress Notes (Signed)
   PRENATAL VISIT NOTE  Subjective:  Rhonda Waters is a 31 y.o. [phone] at 4236w4d being seen today for ongoing prenatal care.  She is currently monitored for the following issues for this low-risk pregnancy and has Hx of cholelithiasis; Obesity (BMI 30-39.9); Hx of gastroesophageal reflux (GERD); Cholecystitis with cholelithiasis; Abdominal pain, chronic, epigastric; Supervision of normal pregnancy; and Cystitis during pregnancy in first trimester, antepartum on her problem list.  Patient reports pelvic pressure.  Contractions: Irregular. Vag. Bleeding: None.  Movement: Present. Denies leaking of fluid.   The following portions of the patient's history were reviewed and updated as appropriate: allergies, current medications, past family history, past medical history, past social history, past surgical history and problem list. Problem list updated.  Objective:   Vitals:   05/23/17 1528  BP: (!) 108/58  Pulse: 88  Weight: 206 lb 11.2 oz (93.8 kg)    Fetal Status: Fetal Heart Rate (bpm): 143 Fundal Height: 39 cm Movement: Present  Presentation: Vertex  General:  Alert, oriented and cooperative. Patient is in no acute distress.  Skin: Skin is warm and dry. No rash noted.   Cardiovascular: Normal heart rate noted  Respiratory: Normal respiratory effort, no problems with respiration noted  Abdomen: Soft, gravid, appropriate for gestational age.  Pain/Pressure: Present; negative CVAT.      Pelvic: Cervical exam performed Dilation: 2 Effacement (%): 50 Station: -2  Extremities: Normal range of motion.  Edema: None  Mental Status:  Normal mood and affect. Normal behavior. Normal judgment and thought content.   Assessment and Plan:  Pregnancy: G4P3003 at 7436w4d  1. Urinary frequency Patient states that she feels pelvic pressure which causes her to feel like she has to go to the bathroom, she denies burning, low back pain, abdominal pain.   2. Encounter for supervision of other  normal pregnancy in third trimester Labor precautions reviewed  3. Cystitis during pregnancy in first trimester, antepartum Patient denies urgency, dysuria, blood in her urine or low back pain.   Term labor symptoms and general obstetric precautions including but not limited to vaginal bleeding, contractions, leaking of fluid and fetal movement were reviewed in detail with the patient. Please refer to After Visit Summary for other counseling recommendations.  Return in about 1 week (around 05/30/2017).   Marylene LandKathryn Lorraine Tanairy Payeur, CNM

## 2017-05-31 ENCOUNTER — Ambulatory Visit (INDEPENDENT_AMBULATORY_CARE_PROVIDER_SITE_OTHER): Payer: Self-pay | Admitting: Student

## 2017-05-31 VITALS — BP 116/58 | HR 97 | Wt 206.5 lb

## 2017-05-31 DIAGNOSIS — O368190 Decreased fetal movements, unspecified trimester, not applicable or unspecified: Secondary | ICD-10-CM | POA: Insufficient documentation

## 2017-05-31 DIAGNOSIS — Z3483 Encounter for supervision of other normal pregnancy, third trimester: Secondary | ICD-10-CM

## 2017-05-31 DIAGNOSIS — O368130 Decreased fetal movements, third trimester, not applicable or unspecified: Secondary | ICD-10-CM

## 2017-05-31 NOTE — Patient Instructions (Signed)

## 2017-05-31 NOTE — Progress Notes (Signed)
   PRENATAL VISIT NOTE  Subjective:  Rhonda Waters is a 68[name]30 y.o. [phone] at 3346w4d being seen today for ongoing prenatal care.  She is currently monitored for the following issues for this low-risk pregnancy and has Hx of cholelithiasis; Obesity (BMI 30-39.9); Hx of gastroesophageal reflux (GERD); Cholecystitis with cholelithiasis; Abdominal pain, chronic, epigastric; Supervision of normal pregnancy; Cystitis during pregnancy in first trimester, antepartum; and Decreased fetal movements on her problem list.  Patient reports decreased fetal movements. .  Contractions: Irregular.  .  Movement: Present. Denies leaking of fluid.   The following portions of the patient's history were reviewed and updated as appropriate: allergies, current medications, past family history, past medical history, past social history, past surgical history and problem list. Problem list updated.  Objective:   Vitals:   05/31/17 1357  BP: (!) 116/58  Pulse: 97  Weight: 206 lb 8 oz (93.7 kg)    Fetal Status: Fetal Heart Rate (bpm): 140 Fundal Height: 40 cm Movement: Present     General:  Alert, oriented and cooperative. Patient is in no acute distress.  Skin: Skin is warm and dry. No rash noted.   Cardiovascular: Normal heart rate noted  Respiratory: Normal respiratory effort, no problems with respiration noted  Abdomen: Soft, gravid, appropriate for gestational age.  Pain/Pressure: Absent     Pelvic: Cervical exam deferred        Extremities: Normal range of motion.     Mental Status:  Normal mood and affect. Normal behavior. Normal judgment and thought content.   Assessment and Plan:  Pregnancy: G4P3003 at 6346w4d  1. Decreased fetal movements in third trimester, single or unspecified fetus  - Fetal nonstress test: 140 BPM with mod variability; present acel, negative acels. Reviewed with patient the importance of kick counts and how to measure fetal movements; patient verbalized understanding.   2.  Encounter for supervision of other normal pregnancy in third trimester Patient to have one more visit with AFI and NST week of Aug 20th; induction to be schedule for postdates if patient does not deliver by 06-06-2017.   Term labor symptoms and general obstetric precautions including but not limited to vaginal bleeding, contractions, leaking of fluid and fetal movement were reviewed in detail with the patient. Please refer to After Visit Summary for other counseling recommendations.  Return in about 1 week (around 06/07/2017).   Marylene LandKathryn Lorraine Tarri Guilfoil, CNM

## 2017-06-04 ENCOUNTER — Inpatient Hospital Stay (HOSPITAL_COMMUNITY)
Admission: AD | Admit: 2017-06-04 | Discharge: 2017-06-05 | DRG: 775 | Disposition: A | Discharge status: Home / Self Care | Payer: Medicaid Other | Source: Ambulatory Visit | Attending: Obstetrics and Gynecology | Admitting: Obstetrics and Gynecology

## 2017-06-04 ENCOUNTER — Encounter (HOSPITAL_COMMUNITY): Payer: Self-pay

## 2017-06-04 DIAGNOSIS — O2311 Infections of bladder in pregnancy, first trimester: Secondary | ICD-10-CM

## 2017-06-04 DIAGNOSIS — Z3A40 40 weeks gestation of pregnancy: Secondary | ICD-10-CM

## 2017-06-04 DIAGNOSIS — Z3493 Encounter for supervision of normal pregnancy, unspecified, third trimester: Secondary | ICD-10-CM | POA: Diagnosis present

## 2017-06-04 DIAGNOSIS — Z3483 Encounter for supervision of other normal pregnancy, third trimester: Secondary | ICD-10-CM

## 2017-06-04 LAB — TYPE AND SCREEN
ABO/RH(D): O POS
ANTIBODY SCREEN: NEGATIVE

## 2017-06-04 LAB — CBC
HCT: 30.3 % — ABNORMAL LOW (ref 36.0–46.0)
HEMOGLOBIN: 10 g/dL — AB (ref 12.0–15.0)
MCH: 25.8 pg — ABNORMAL LOW (ref 26.0–34.0)
MCHC: 33 g/dL (ref 30.0–36.0)
MCV: 78.1 fL (ref 78.0–100.0)
Platelets: 329 10*3/uL (ref 150–400)
RBC: 3.88 MIL/uL (ref 3.87–5.11)
RDW: 16.1 % — AB (ref 11.5–15.5)
WBC: 10.1 10*3/uL (ref 4.0–10.5)

## 2017-06-04 LAB — POCT FERN TEST: POCT FERN TEST: POSITIVE

## 2017-06-04 LAB — RPR: RPR: NONREACTIVE

## 2017-06-04 LAB — ABO/RH: ABO/RH(D): O POS

## 2017-06-04 MED ORDER — OXYTOCIN 40 UNITS IN LACTATED RINGERS INFUSION - SIMPLE MED
2.5000 [IU]/h | INTRAVENOUS | Status: DC
  Filled 2017-06-04: qty 1000

## 2017-06-04 MED ORDER — TETANUS-DIPHTH-ACELL PERTUSSIS 5-2.5-18.5 LF-MCG/0.5 IM SUSP: 0.5000 mL | Freq: Once | INTRAMUSCULAR | Status: DC

## 2017-06-04 MED ORDER — WITCH HAZEL-GLYCERIN EX PADS: 1.0000 "application " | MEDICATED_PAD | CUTANEOUS | Status: DC | PRN

## 2017-06-04 MED ORDER — DIBUCAINE 1 % RE OINT: 1.0000 "application " | TOPICAL_OINTMENT | RECTAL | Status: DC | PRN

## 2017-06-04 MED ORDER — LIDOCAINE HCL (PF) 1 % IJ SOLN
30.0000 mL | INTRAMUSCULAR | Status: DC | PRN
  Filled 2017-06-04: qty 30

## 2017-06-04 MED ORDER — FLEET ENEMA 7-19 GM/118ML RE ENEM: 1.0000 | ENEMA | RECTAL | Status: DC | PRN

## 2017-06-04 MED ORDER — LACTATED RINGERS IV SOLN
INTRAVENOUS | Status: DC
  Administered 2017-06-04: 06:00:00 via INTRAVENOUS

## 2017-06-04 MED ORDER — SIMETHICONE 80 MG PO CHEW: 80.0000 mg | CHEWABLE_TABLET | ORAL | Status: DC | PRN

## 2017-06-04 MED ORDER — COCONUT OIL OIL: 1.0000 "application " | TOPICAL_OIL | Status: DC | PRN

## 2017-06-04 MED ORDER — ONDANSETRON HCL 4 MG/2ML IJ SOLN: 4.0000 mg | INTRAMUSCULAR | Status: DC | PRN

## 2017-06-04 MED ORDER — OXYTOCIN BOLUS FROM INFUSION
500.0000 mL | Freq: Once | INTRAVENOUS | Status: AC
  Administered 2017-06-04: 500 mL via INTRAVENOUS

## 2017-06-04 MED ORDER — IBUPROFEN 600 MG PO TABS
600.0000 mg | ORAL_TABLET | Freq: Four times a day (QID) | ORAL | Status: DC
  Administered 2017-06-04 – 2017-06-05 (×5): 600 mg via ORAL
  Filled 2017-06-04 (×5): qty 1

## 2017-06-04 MED ORDER — OXYCODONE-ACETAMINOPHEN 5-325 MG PO TABS
1.0000 | ORAL_TABLET | ORAL | Status: DC | PRN
  Administered 2017-06-04: 1 via ORAL
  Filled 2017-06-04: qty 1

## 2017-06-04 MED ORDER — ACETAMINOPHEN 325 MG PO TABS: 650.0000 mg | ORAL_TABLET | ORAL | Status: DC | PRN

## 2017-06-04 MED ORDER — LACTATED RINGERS IV SOLN: 500.0000 mL | INTRAVENOUS | Status: DC | PRN

## 2017-06-04 MED ORDER — ZOLPIDEM TARTRATE 5 MG PO TABS: 5.0000 mg | ORAL_TABLET | Freq: Every evening | ORAL | Status: DC | PRN

## 2017-06-04 MED ORDER — ONDANSETRON HCL 4 MG/2ML IJ SOLN: 4.0000 mg | Freq: Four times a day (QID) | INTRAMUSCULAR | Status: DC | PRN

## 2017-06-04 MED ORDER — OXYCODONE-ACETAMINOPHEN 5-325 MG PO TABS: 2.0000 | ORAL_TABLET | ORAL | Status: DC | PRN

## 2017-06-04 MED ORDER — ACETAMINOPHEN 325 MG PO TABS
650.0000 mg | ORAL_TABLET | ORAL | Status: DC | PRN
  Administered 2017-06-04: 650 mg via ORAL
  Filled 2017-06-04: qty 2

## 2017-06-04 MED ORDER — PRENATAL MULTIVITAMIN CH
1.0000 | ORAL_TABLET | Freq: Every day | ORAL | Status: DC
  Administered 2017-06-05: 1 via ORAL
  Filled 2017-06-04: qty 1

## 2017-06-04 MED ORDER — DIPHENHYDRAMINE HCL 25 MG PO CAPS: 25.0000 mg | ORAL_CAPSULE | Freq: Four times a day (QID) | ORAL | Status: DC | PRN

## 2017-06-04 MED ORDER — SENNOSIDES-DOCUSATE SODIUM 8.6-50 MG PO TABS
2.0000 | ORAL_TABLET | ORAL | Status: DC
  Administered 2017-06-04: 2 via ORAL
  Filled 2017-06-04: qty 2

## 2017-06-04 MED ORDER — FENTANYL CITRATE (PF) 100 MCG/2ML IJ SOLN
50.0000 ug | INTRAMUSCULAR | Status: DC | PRN
  Administered 2017-06-04: 50 ug via INTRAVENOUS
  Administered 2017-06-04: 100 ug via INTRAVENOUS
  Filled 2017-06-04 (×2): qty 2

## 2017-06-04 MED ORDER — BENZOCAINE-MENTHOL 20-0.5 % EX AERO
1.0000 "application " | INHALATION_SPRAY | CUTANEOUS | Status: DC | PRN
  Administered 2017-06-04: 1 via TOPICAL
  Filled 2017-06-04: qty 56

## 2017-06-04 MED ORDER — SOD CITRATE-CITRIC ACID 500-334 MG/5ML PO SOLN: 30.0000 mL | ORAL | Status: DC | PRN

## 2017-06-04 MED ORDER — ONDANSETRON HCL 4 MG PO TABS: 4.0000 mg | ORAL_TABLET | ORAL | Status: DC | PRN

## 2017-06-04 NOTE — MAU Note (Signed)
Contractions since 1600. Some bloody show.

## 2017-06-04 NOTE — Progress Notes (Signed)
Before I checked cervix, a small amount clear fluid noted. Pt reports felt some leaking at approx 3:30.  Checked fern slide at this time and it is positive.

## 2017-06-04 NOTE — Anesthesia Pain Management Evaluation Note (Signed)
  CRNA Pain Management Visit Note  Patient: Rhonda Waters, 31 y.o., female  "Hello I am a member of the anesthesia team at [facility]. We have an anesthesia team available at all times to provide care throughout the hospital, including epidural management and anesthesia for C-section. I don't know your plan for the delivery whether it a natural birth, water birth, IV sedation, nitrous supplementation, doula or epidural, but we want to meet your pain goals."   1.Was your pain managed to your expectations on prior hospitalizations?   Yes   2.What is your expectation for pain management during this hospitalization?     Labor support without medications  3.How can we help you reach that goal? Pt requests to be natural labor and has done this in the past successfully - she understands that pain is part of the process of a natural child birth  Record the patient's initial score and the patient's pain goal.   Pain: 8  Pain Goal: 10 [facility] wants you to be able to say your pain was always managed very well.  Mauricia Area 06/04/2017

## 2017-06-04 NOTE — H&P (Signed)
LABOR AND DELIVERY ADMISSION HISTORY AND PHYSICAL NOTE  Rhonda Waters is a 31 y.o. female [phone] with IUP at [redacted]w[redacted]d by 19 week [place] presenting for SOL and subsequent SROM during labor evaluation.  She reports positive fetal movement. She denies vaginal bleeding.  She is having contractions with 9/10 pain. Has had 3 prior vaginal deliveries without complication or epidural.   Prenatal History/Complications:  Past Medical History: Past Medical History:  Diagnosis Date  . GERD (gastroesophageal reflux disease)   . Nausea & vomiting 10/20/2011    Past Surgical History: Past Surgical History:  Procedure Laterality Date  . CHOLECYSTECTOMY  10/20/2012   Procedure: LAPAROSCOPIC CHOLECYSTECTOMY WITH INTRAOPERATIVE CHOLANGIOGRAM;  Surgeon: Velora Heckler, MD;  Location: [facility] OR;  Service: General;  Laterality: N/A;    Obstetrical History: OB History    Gravida Para Term Preterm AB Living   4 3 3     3    SAB TAB Ectopic Multiple Live Births           3      Social History: Social History   Social History  . Marital status: Single    Spouse name: N/A  . Number of children: N/A  . Years of education: N/A   Social History Main Topics  . Smoking status: Never Smoker  . Smokeless tobacco: Never Used  . Alcohol use No  . Drug use: No  . Sexual activity: No   Other Topics Concern  . None   Social History Narrative  . None    Family History: History reviewed. No pertinent family history.  Allergies: Allergies  Allergen Reactions  . Other Rash    Sour cream    Prescriptions Prior to Admission  Medication Sig Dispense Refill Last Dose  . Prenatal Vit-Fe Fumarate-FA (PREPLUS) 27-1 MG TABS Take 1 tablet by mouth daily. 30 tablet 13 06/03/2017 at Unknown time     Review of Systems   All systems reviewed and negative except as stated in HPI  Blood pressure 104/76, pulse 77, temperature 97.6 F (36.4 C), temperature source Oral, resp. rate 20, height 5\' 1"  (1.549  m), weight 94.8 kg (209 lb), last menstrual period 07/27/2016, unknown if currently breastfeeding. General appearance: alert and cooperative Lungs: no respiratory distress Heart: regular rate Abdomen: soft, non-tender Extremities: No calf swelling or tenderness Presentation: cephalic by exam Fetal monitoring: baseline 145, accelerations present no decels Uterine activity: contractions q4-5 mins Dilation: 4.5 Effacement (%): 80 Station: -2 Exam by:: Sherlyn Hay   Prenatal labs: ABO, Rh: O/Positive/-- (05/22 0949) Antibody: Negative (05/22 0949) Rubella: immune RPR: Non Reactive (05/22 0949)  HBsAg: Negative (05/22 0949)  HIV:   NR GBS:   negative Genetic screening:  Not done Anatomy US: normal  Prenatal Transfer Tool  Maternal Diabetes: No Genetic Screening: Declined Maternal Ultrasounds/Referrals: Normal Fetal Ultrasounds or other Referrals:  Referred to Materal Fetal Medicine  Maternal Substance Abuse:  No Significant Maternal Medications:  None Significant Maternal Lab Results: None  Results for orders placed or performed during the hospital encounter of 06/04/17 (from the past 24 hour(s))  Fern Test   Collection Time: 06/04/17  5:06 AM  Result Value Ref Range   POCT Fern Test Positive = ruptured amniotic membanes   CBC   Collection Time: 06/04/17  5:20 AM  Result Value Ref Range   WBC 10.1 4.0 - 10.5 K/uL   RBC 3.88 3.87 - 5.11 MIL/uL   Hemoglobin 10.0 (L) 12.0 - 15.0 g/dL   HCT 67.1 (  L) 36.0 - 46.0 %   MCV 78.1 78.0 - 100.0 fL   MCH 25.8 (L) 26.0 - 34.0 pg   MCHC 33.0 30.0 - 36.0 g/dL   RDW 40.9 (H) 81.1 - 91.4 %   Platelets 329 150 - 400 K/uL    Patient Active Problem List   Diagnosis Date Noted  . Normal labor 06/04/2017  . Decreased fetal movements 05/31/2017  . Supervision of normal pregnancy 01/11/2017  . Cystitis during pregnancy in first trimester, antepartum 01/11/2017  . Abdominal pain, chronic, epigastric 05/17/2013  . Cholecystitis with  cholelithiasis 10/20/2012  . Hx of cholelithiasis 05/03/2012  . Obesity (BMI 30-39.9) 05/03/2012  . Hx of gastroesophageal reflux (GERD) 05/03/2012    Assessment: Rhonda Waters is a 31 y.o. [phone] at [redacted]w[redacted]d here for SROM  #Labor: SOL #Pain: IV pain medication, no epidural #FWB: Cat 1 tracing #ID:  GBS negative #MOF: both #MOC: nexplanon #Circ:  n/a  Tillman Sers, DO PGY-2 8/18/20186:09 AM  CNM attestation:  I have seen and examined this patient; I agree with above documentation in the resident's note.   Rhonda Waters is a 31 y.o. [phone] here for SOL/leaking fluid  PE: BP (!) 110/98 (BP Location: Right Arm)   Pulse 90   Temp 97.8 F (36.6 C) (Oral)   Resp 18   Ht 5\' 1"  (1.549 m)   Wt 94.8 kg (209 lb)   LMP 07/27/2016   BMI 39.49 kg/m  Gen: calm comfortable, NAD Resp: normal effort, no distress Abd: gravid  ROS, labs, PMH reviewed  Plan: Ad[facility] to [facility] Expectant management Anticipate SVD  Cam Hai 06/04/2017, 9:13 AM

## 2017-06-05 NOTE — Discharge Summary (Signed)
OB Discharge Summary     Patient Name: Rhonda Waters DOB: 1986/01/22 MRN: [id number]  Date of admission: 06/04/2017 Delivering MD: Frederik Pear   Date of discharge: 06/05/2017  Admitting diagnosis: 40wks, pain Intrauterine pregnancy: [redacted]w[redacted]d     Secondary diagnosis:  Active Problems:   Normal labor  Additional problems: none     Discharge diagnosis: Term Pregnancy Delivered                                                                                                Post partum procedures:none  Augmentation: none  Complications: None  Hospital course:  Onset of Labor With Vaginal Delivery     31 y.o. yo [id number] at [redacted]w[redacted]d was admitted in Active Labor on 06/04/2017. Patient had an uncomplicated labor course as follows:  Membrane Rupture Time/Date: 3:30 AM ,06/04/2017   Intrapartum Procedures: Episiotomy: None [1]                                         Lacerations:  None [1]  Patient had a delivery of a Viable infant. 06/04/2017  Information for the patient's newborn:  [place] [[id number]]  Delivery Method: Vaginal, Spontaneous Delivery (Filed from Delivery Summary)    Pateint had an uncomplicated postpartum course.  She is ambulating, tolerating a regular diet, passing flatus, and urinating well. Patient is discharged home in stable condition on 06/05/17.   Physical exam  Vitals:   06/04/17 1150 06/04/17 1540 06/05/17 0000 06/05/17 0500  BP: (!) 100/56 109/82 (!) 95/52 (!) 106/54  Pulse: 70 72 68 64  Resp: 16 16 18 16   Temp: 98.1 F (36.7 C) 99.6 F (37.6 C) 100.3 F (37.9 C) (!) 97.4 F (36.3 C)  TempSrc: Oral Oral  Oral  SpO2: 100%     Weight:      Height:       General: alert, cooperative and no distress Lochia: appropriate Uterine Fundus: firm Incision: Healing well with no significant drainage DVT Evaluation: No evidence of DVT seen on physical exam. Labs: Lab Results  Component Value Date   WBC 10.1 06/04/2017   HGB 10.0 (L)  06/04/2017   HCT 30.3 (L) 06/04/2017   MCV 78.1 06/04/2017   PLT 329 06/04/2017   CMP Latest Ref Rng & Units 01/07/2017  Glucose 65 - 99 mg/dL 99  BUN 6 - 20 mg/dL <1(H)  Creatinine 0.86 - 1.00 mg/dL 5.78  Sodium 469 - 629 mmol/L 135  Potassium 3.5 - 5.1 mmol/L 3.2(L)  Chloride 101 - 111 mmol/L 106  CO2 22 - 32 mmol/L 19(L)  Calcium 8.9 - 10.3 mg/dL 5.2(W)  Total Protein 6.5 - 8.1 g/dL 6.8  Total Bilirubin 0.3 - 1.2 mg/dL 1.0  Alkaline Phos 38 - 126 U/L 61  AST 15 - 41 U/L 21  ALT 14 - 54 U/L 12(L)    Discharge instruction: per After Visit Summary and "Baby and Me Booklet".  After visit meds:  Allergies as of 06/05/2017  Reactions   Other Rash   Sour cream      Medication List    TAKE these medications   PREPLUS 27-1 MG Tabs Take 1 tablet by mouth daily.       Diet: routine diet  Activity: Advance as tolerated. Pelvic rest for 6 weeks.   Outpatient follow up:6 weeks Follow up Appt: Future Appointments Date Time Provider Department Center  06/07/2017 1:40 PM [facility]  06/07/2017 2:40 PM Kingston Bing, MD [facility]   Follow up Visit:No Follow-up on file.  Postpartum contraception: Nexplanon  Newborn Data: Live born female  Birth Weight: 8 lb 5.5 oz (3785 g) APGAR: 9, 9  Baby Feeding: Breast Disposition:home with mother   06/05/2017 Marylene Land, CNM

## 2017-06-05 NOTE — Lactation Note (Signed)
This note was copied from a baby's chart. Lactation Consultation Note:Lactation Brochure given in Spanish. Mother has teenage daughter at the bedside. She reports that it is ok for her to interpret teaching. Mother has been mostly bottle feeding. She reports that she breastfed her first child for 3 yrs. She didn't breastfeed the middle two children. She plans to breast feed and bottle feed. She reports that she breastfed infant during the night when she was very fussy. Mother reports that she plans to do breast and bottle when she gets home.  Mother was given a harmony hand pump and advised to pump for 15 mins on each breast. # 27 flange fit. Mother is able to hand express colostrum. Encouraged mother to breastfeed frequently to bring milk to volume. Mother to cue base feed and feed at least 8-12 times in 24 hours.  Discussed supply and demand. Mother receptive to all teaching.   Patient Name: Rhonda Waters [id number] Date: 06/05/2017 Reason for consult: Initial assessment   Maternal Data    Feeding Nipple Type: Slow - flow  LATCH Score                   Interventions    Lactation Tools Discussed/Used     Consult Status Consult Status: Follow-up Date: 06/06/17 Follow-up type: In-patient    Stevan Born [facility] 06/05/2017, 12:31 PM

## 2017-06-06 ENCOUNTER — Encounter: Payer: Self-pay | Admitting: General Practice

## 2017-06-07 ENCOUNTER — Encounter: Payer: Self-pay | Admitting: Obstetrics and Gynecology

## 2017-06-07 ENCOUNTER — Other Ambulatory Visit: Payer: Self-pay

## 2017-06-16 NOTE — Progress Notes (Signed)
Post discharge chart review completed.  

## 2017-07-11 ENCOUNTER — Ambulatory Visit: Payer: Self-pay | Admitting: Certified Nurse Midwife

## 2017-07-26 ENCOUNTER — Ambulatory Visit: Payer: Self-pay | Admitting: Family Medicine

## 2017-08-25 IMAGING — US US OB LIMITED
1 series · 14 of 18 positions shown · non-contrast
Comparison: none

CLINICAL DATA: Acute left lower quadrant abdominal pain.

EXAM:
LIMITED OBSTETRIC ULTRASOUND

[Series 1: us ob limited · 0.22mm/px · 14 of 18 slices shown]
[im 1/18]
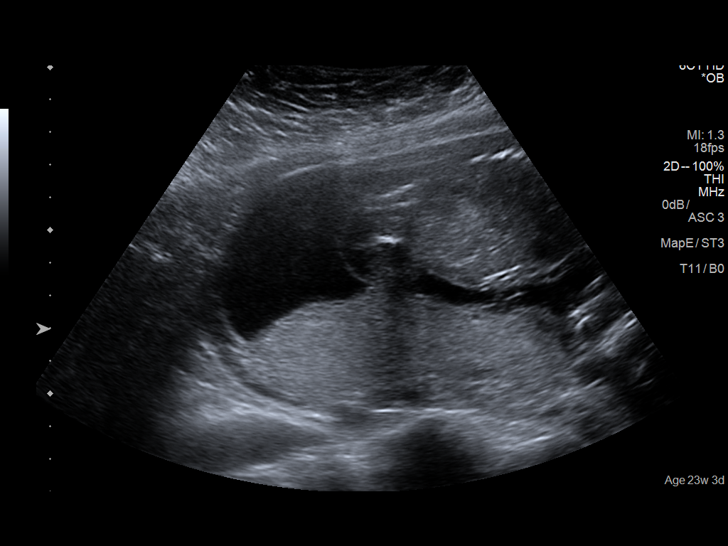
[im 2/18]
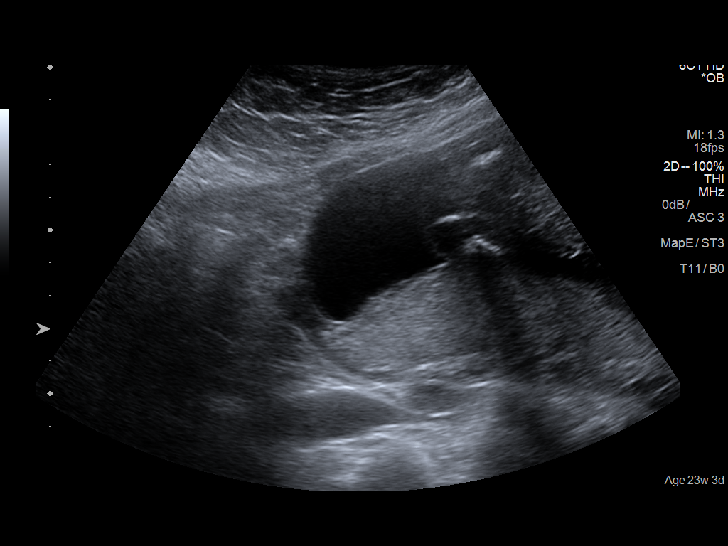
[im 4/18]
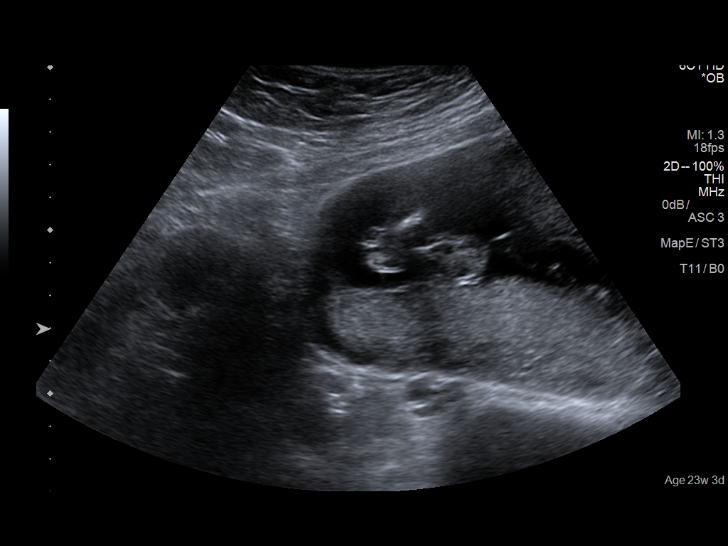
[im 5/18]
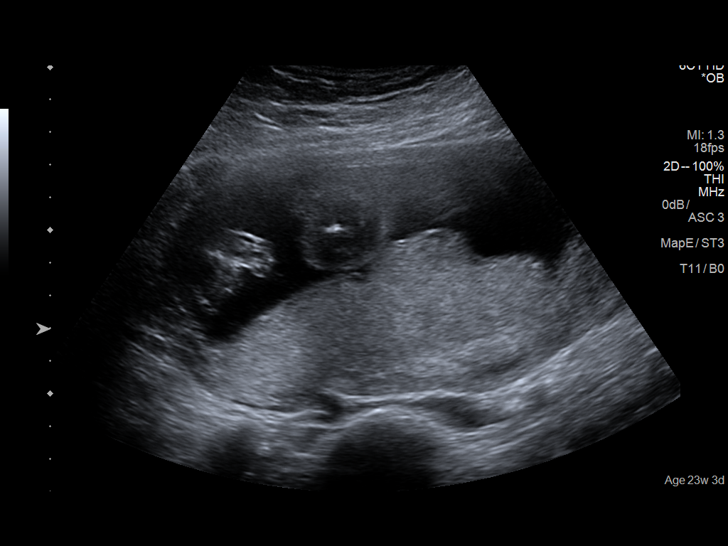
[im 6/18]
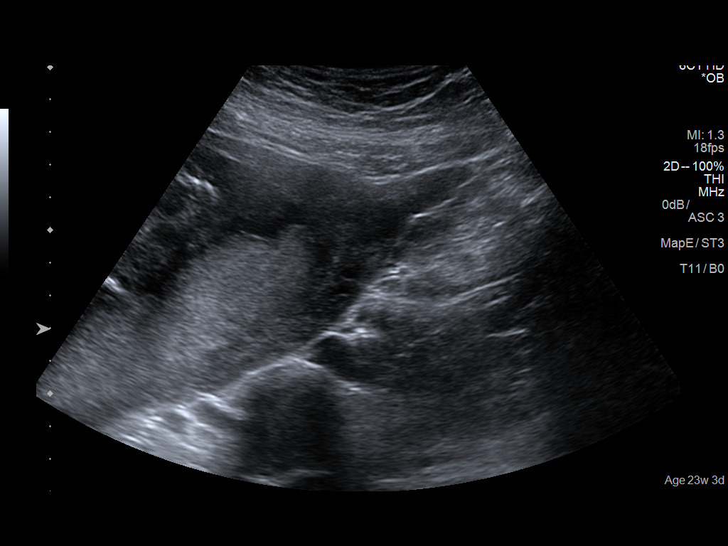
[im 8/18]
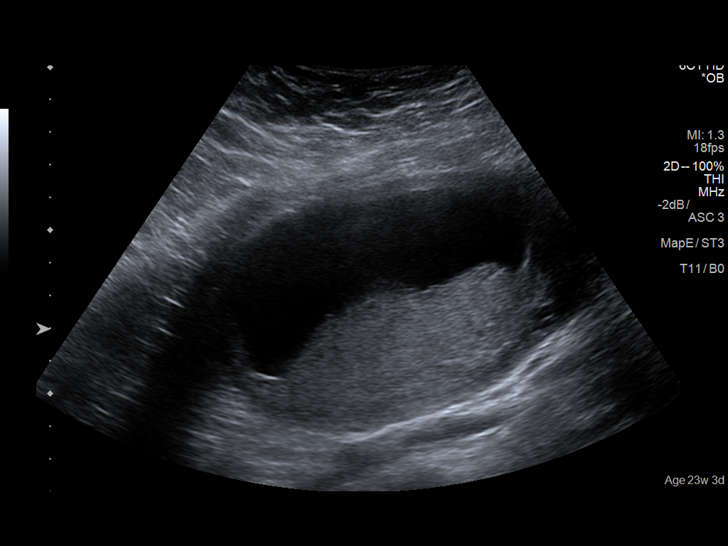
[im 9/18]
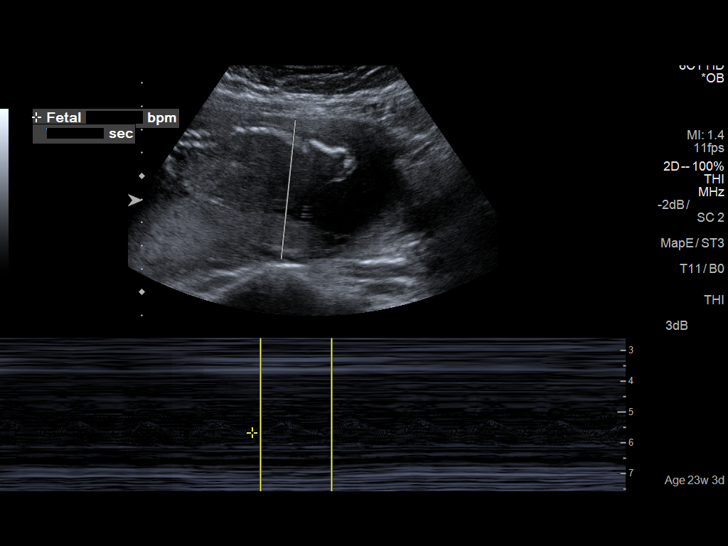
[im 10/18]
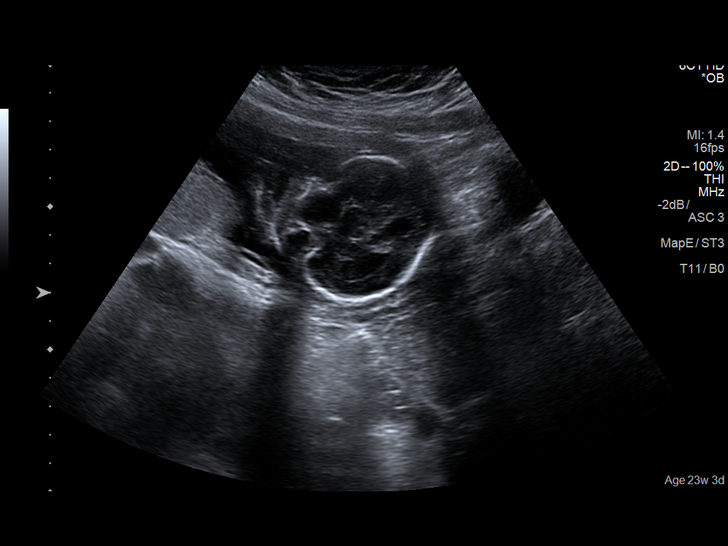
[im 11/18]
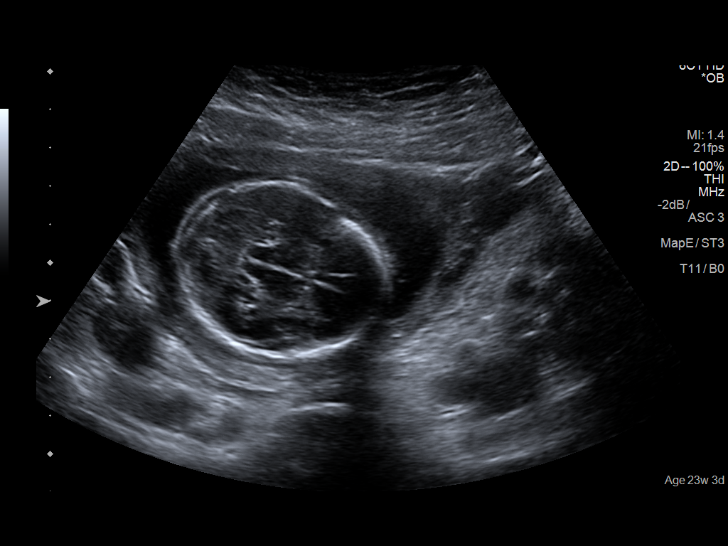
[im 13/18]
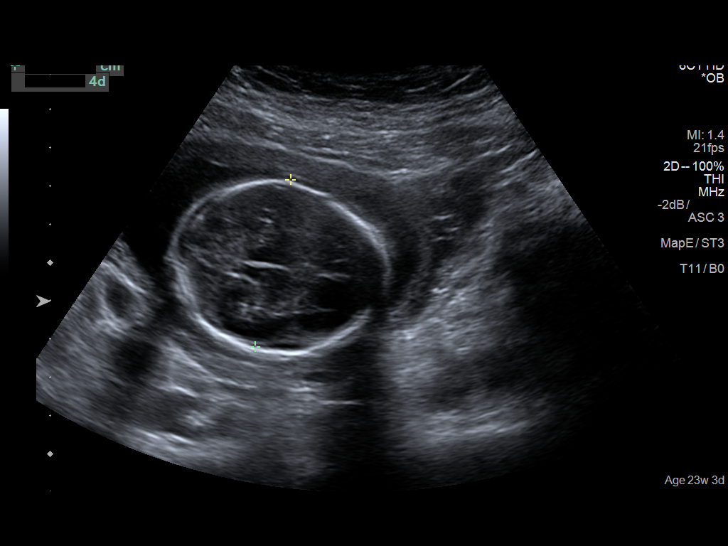
[im 14/18]
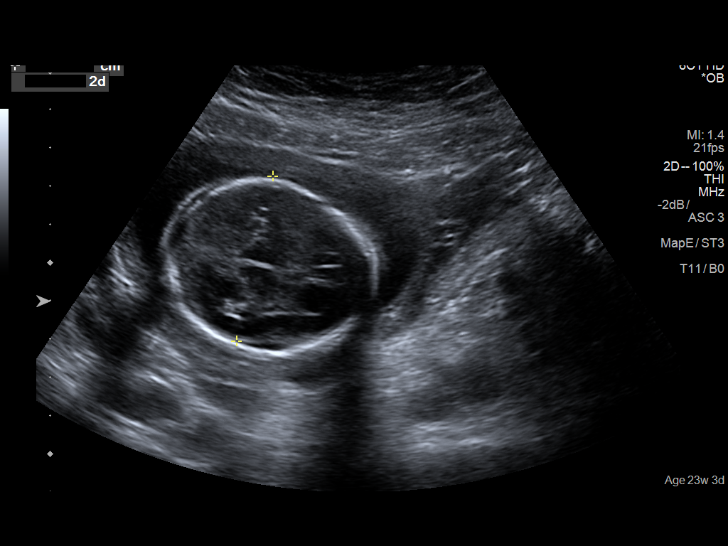
[im 15/18]
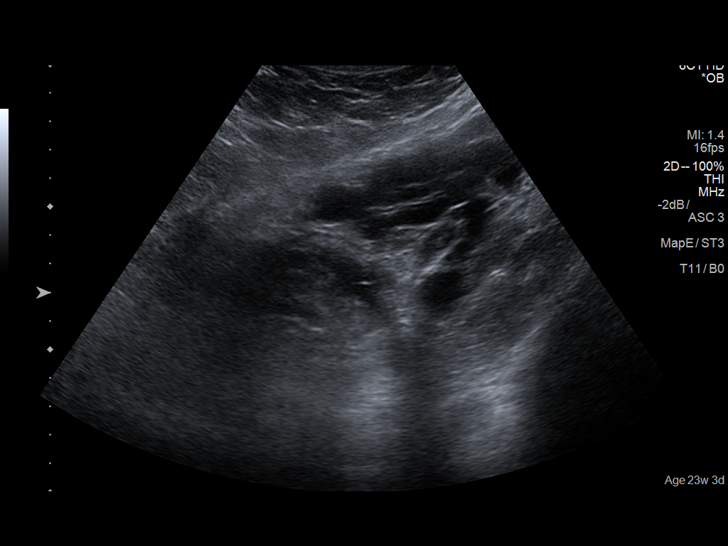
[im 17/18]
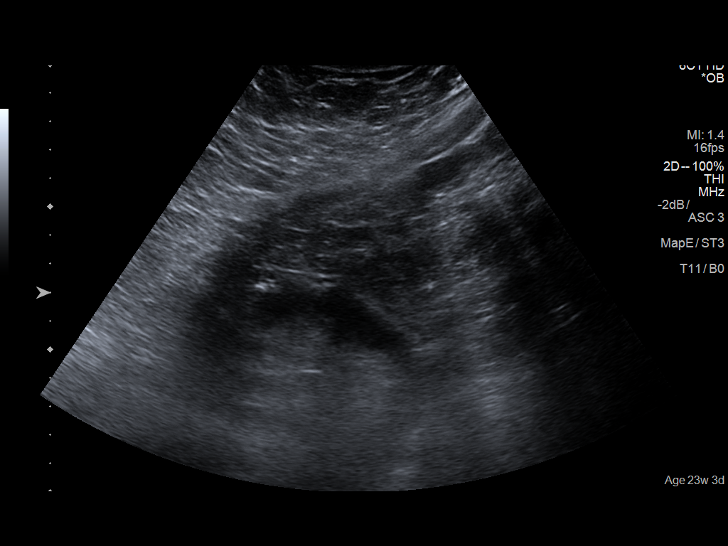
[im 18/18]
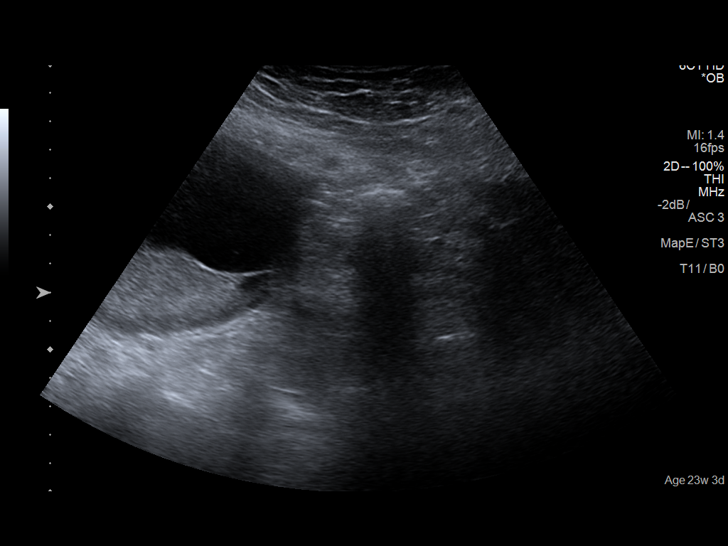

[14 of 18 positions shown; findings below may reference images not displayed]

FINDINGS: Number of Fetuses: 1

Heart Rate:  144 bpm

Movement: Yes

Presentation: Cephalic

Placental Location: Posterior

Previa: No

Amniotic Fluid (Subjective):  Within normal limits.

BPD:  4.45cm 19w  3d

MATERNAL FINDINGS:

Cervix:  Appears closed.

Uterus/Adnexae:  No abnormality visualized.
IMPRESSION: Single live intrauterine gestation of 19 weeks 3 days.

This exam is performed on an emergent basis and does not
comprehensively evaluate fetal size, dating, or anatomy; follow-up
complete OB US should be considered if further fetal assessment is
warranted.

## 2022-11-14 ENCOUNTER — Inpatient Hospital Stay (HOSPITAL_COMMUNITY): Payer: Self-pay

## 2022-11-14 ENCOUNTER — Other Ambulatory Visit: Payer: Self-pay

## 2022-11-14 ENCOUNTER — Inpatient Hospital Stay (HOSPITAL_COMMUNITY)
Admission: EM | Admit: 2022-11-14 | Discharge: 2022-11-14 | Disposition: A | Discharge status: Home / Self Care | Payer: Self-pay | Attending: Obstetrics & Gynecology | Admitting: Obstetrics & Gynecology

## 2022-11-14 ENCOUNTER — Encounter (HOSPITAL_COMMUNITY): Payer: Self-pay | Admitting: Emergency Medicine

## 2022-11-14 DIAGNOSIS — O09521 Supervision of elderly multigravida, first trimester: Secondary | ICD-10-CM | POA: Insufficient documentation

## 2022-11-14 DIAGNOSIS — O3680X0 Pregnancy with inconclusive fetal viability, not applicable or unspecified: Secondary | ICD-10-CM

## 2022-11-14 DIAGNOSIS — O26891 Other specified pregnancy related conditions, first trimester: Secondary | ICD-10-CM | POA: Insufficient documentation

## 2022-11-14 DIAGNOSIS — Z1152 Encounter for screening for COVID-19: Secondary | ICD-10-CM | POA: Insufficient documentation

## 2022-11-14 DIAGNOSIS — Z3A01 Less than 8 weeks gestation of pregnancy: Secondary | ICD-10-CM | POA: Insufficient documentation

## 2022-11-14 LAB — COMPREHENSIVE METABOLIC PANEL
ALT: 30 U/L (ref 0–44)
AST: 28 U/L (ref 15–41)
Albumin: 3.7 g/dL (ref 3.5–5.0)
Alkaline Phosphatase: 60 U/L (ref 38–126)
Anion gap: 10 (ref 5–15)
BUN: 7 mg/dL (ref 6–20)
CO2: 21 mmol/L — ABNORMAL LOW (ref 22–32)
Calcium: 9.2 mg/dL (ref 8.9–10.3)
Chloride: 104 mmol/L (ref 98–111)
Creatinine, Ser: 0.66 mg/dL (ref 0.44–1.00)
GFR, Estimated: >60 mL/min (ref >60)
Glucose, Bld: 95 mg/dL (ref 70–99)
Potassium: 3.4 mmol/L — ABNORMAL LOW (ref 3.5–5.1)
Sodium: 135 mmol/L (ref 135–145)
Total Bilirubin: 0.5 mg/dL (ref 0.3–1.2)
Total Protein: 7.6 g/dL (ref 6.5–8.1)

## 2022-11-14 LAB — RESP PANEL BY RT-PCR (RSV, FLU A&B, COVID)  RVPGX2
Influenza A by PCR: NEGATIVE
Influenza B by PCR: NEGATIVE
Resp Syncytial Virus by PCR: NEGATIVE
SARS Coronavirus 2 by RT PCR: NEGATIVE

## 2022-11-14 LAB — CBC
HCT: 38 % (ref 36.0–46.0)
Hemoglobin: 12.9 g/dL (ref 12.0–15.0)
MCH: 29.1 pg (ref 26.0–34.0)
MCHC: 33.9 g/dL (ref 30.0–36.0)
MCV: 85.8 fL (ref 80.0–100.0)
Platelets: 218 10*3/uL (ref 150–400)
RBC: 4.43 MIL/uL (ref 3.87–5.11)
RDW: 13.7 % (ref 11.5–15.5)
WBC: 12 10*3/uL — ABNORMAL HIGH (ref 4.0–10.5)
nRBC: 0 % (ref 0.0–0.2)

## 2022-11-14 LAB — LIPASE, BLOOD: Lipase: 29 U/L (ref 11–51)

## 2022-11-14 LAB — URINALYSIS, ROUTINE W REFLEX MICROSCOPIC
Bilirubin Urine: NEGATIVE
Glucose, UA: NEGATIVE mg/dL
Hgb urine dipstick: NEGATIVE
Ketones, ur: NEGATIVE mg/dL
Leukocytes,Ua: NEGATIVE
Nitrite: NEGATIVE
Protein, ur: NEGATIVE mg/dL
Specific Gravity, Urine: 1.005 (ref 1.005–1.030)
pH: 6 (ref 5.0–8.0)

## 2022-11-14 LAB — I-STAT BETA HCG BLOOD, ED (MC, WL, AP ONLY): I-stat hCG, quantitative: 42.6 m[IU]/mL — ABNORMAL HIGH (ref <5)

## 2022-11-14 LAB — HCG, QUANTITATIVE, PREGNANCY: hCG, Beta Chain, Quant, S: 49 m[IU]/mL — ABNORMAL HIGH (ref <5)

## 2022-11-14 NOTE — MAU Note (Signed)
.  Rhonda Waters is a 37 y.o. at [redacted]w[redacted]d here in MAU reporting: lower abd pain that started Friday.  Denies vag bleeding; asking for termination options   Onset of complaint: Friday  Pain score: 9 Vitals:   11/14/22 1058 11/14/22 1333  BP: 120/72 125/82  Pulse: 69 74  Resp: 17 17  Temp: 98.7 F (37.1 C)   SpO2: 99% 97%   Lab orders placed from triage:   ua

## 2022-11-14 NOTE — MAU Provider Note (Signed)
History     CSN: [id number]  Arrival date and time: 11/14/22 1007   Chief Complaint  Patient presents with   Abdominal Pain   HPI  Rhonda Waters is a 37 y.o. [id number] at 77w2dwho [facility]. Patient reports the pain started on Friday. Patient rates the pain as a 9/10 and has not tried anything for the pain. She denies any vaginal bleeding, discharge, and leaking of fluid. Denies any constipation, diarrhea or any urinary complaints. She reports she did not know she was pregnant as she has not missed a period yet   OB History     Gravida  5   Para  4   Term  4   Preterm      AB      Living  4      SAB      IAB      Ectopic      Multiple  0   Live Births  4           Past Medical History:  Diagnosis Date   GERD (gastroesophageal reflux disease)    Nausea & vomiting 10/20/2011    Past Surgical History:  Procedure Laterality Date   CHOLECYSTECTOMY  10/20/2012   Procedure: LAPAROSCOPIC CHOLECYSTECTOMY WITH INTRAOPERATIVE CHOLANGIOGRAM;  Surgeon: TEarnstine Regal MD;  [place] [facility]  Service: General;  Laterality: N/A;    History reviewed. No pertinent family history.  Social History   Tobacco Use   Smoking status: Never   Smokeless tobacco: Never  Substance Use Topics   Alcohol use: No   Drug use: No    Allergies:  Allergies  Allergen Reactions   Other Rash    Sour cream    No medications prior to admission.    Review of Systems  Constitutional: Negative.  Negative for fatigue and fever.  HENT: Negative.    Respiratory: Negative.  Negative for shortness of breath.   Cardiovascular: Negative.  Negative for chest pain.  Gastrointestinal:  Positive for abdominal pain. Negative for constipation, diarrhea, nausea and vomiting.  Genitourinary: Negative.  Negative for dysuria, vaginal bleeding and vaginal discharge.  Neurological: Negative.  Negative for dizziness and headaches.    Physical Exam   Blood pressure 125/82, pulse 74, temperature 98.7 F (37.1 C), resp. rate 17, weight 77.4 kg, last menstrual period 10/22/2022, SpO2 97 %, unknown if currently breastfeeding.  Patient Vitals for the past 24 hrs:  BP Temp Pulse Resp SpO2 Weight  11/14/22 1333 125/82 -- 74 17 97 % 77.4 kg  11/14/22 1111 -- -- -- -- -- 94 kg  11/14/22 1058 120/72 98.7 F (37.1 C) 69 17 99 % --    Physical Exam Vitals and nursing note reviewed.  Constitutional:      General: She is not in acute distress.    Appearance: She is well-developed.  HENT:     Head: Normocephalic.  Eyes:     Pupils: Pupils are equal, round, and reactive to light.  Cardiovascular:     Rate and Rhythm: Normal rate and regular rhythm.     Heart sounds: Normal heart sounds.  Pulmonary:     Effort: Pulmonary effort is normal. No respiratory distress.     Breath sounds: Normal breath sounds.  Abdominal:     General: Bowel sounds are normal. There is no distension.     Palpations: Abdomen is soft.     Tenderness: There is no abdominal tenderness.  Skin:    General: Skin is warm and dry.  Neurological:     Mental Status: She is alert and oriented to person, place, and time.  Psychiatric:        Mood and Affect: Mood normal.        Behavior: Behavior normal.        Thought Content: Thought content normal.        Judgment: Judgment normal.       MAU Course  Procedures  Results for orders placed or performed during the hospital encounter of 11/14/22 (from the past 24 hour(s))  Resp panel by RT-PCR (RSV, Flu A&B, Covid) Urine, Clean Catch     Status: None   Collection Time: 11/14/22 11:14 AM   Specimen: Urine, Clean Catch; Nasal Swab  Result Value Ref Range   SARS Coronavirus 2 by RT PCR NEGATIVE NEGATIVE   Influenza A by PCR NEGATIVE NEGATIVE   Influenza B by PCR NEGATIVE NEGATIVE   Resp Syncytial Virus by PCR NEGATIVE NEGATIVE  Lipase, blood     Status: None   Collection Time: 11/14/22 11:23  AM  Result Value Ref Range   Lipase 29 11 - 51 U/L  Comprehensive metabolic panel     Status: Abnormal   Collection Time: 11/14/22 11:23 AM  Result Value Ref Range   Sodium 135 135 - 145 mmol/L   Potassium 3.4 (L) 3.5 - 5.1 mmol/L   Chloride 104 98 - 111 mmol/L   CO2 21 (L) 22 - 32 mmol/L   Glucose, Bld 95 70 - 99 mg/dL   BUN 7 6 - 20 mg/dL   Creatinine, Ser 0.66 0.44 - 1.00 mg/dL   Calcium 9.2 8.9 - 10.3 mg/dL   Total Protein 7.6 6.5 - 8.1 g/dL   Albumin 3.7 3.5 - 5.0 g/dL   AST 28 15 - 41 U/L   ALT 30 0 - 44 U/L   Alkaline Phosphatase 60 38 - 126 U/L   Total Bilirubin 0.5 0.3 - 1.2 mg/dL   GFR, Estimated >60 >60 mL/min   Anion gap 10 5 - 15  CBC     Status: Abnormal   Collection Time: 11/14/22 11:23 AM  Result Value Ref Range   WBC 12.0 (H) 4.0 - 10.5 K/uL   RBC 4.43 3.87 - 5.11 MIL/uL   Hemoglobin 12.9 12.0 - 15.0 g/dL   HCT 38.0 36.0 - 46.0 %   MCV 85.8 80.0 - 100.0 fL   MCH 29.1 26.0 - 34.0 pg   MCHC 33.9 30.0 - 36.0 g/dL   RDW 13.7 11.5 - 15.5 %   Platelets 218 150 - 400 K/uL   nRBC 0.0 0.0 - 0.2 %  I-Stat beta hCG blood, ED     Status: Abnormal   Collection Time: 11/14/22 11:28 AM  Result Value Ref Range   I-stat hCG, quantitative 42.6 (H) <5 mIU/mL   Comment 3          Urinalysis, Routine w reflex microscopic -Urine, Clean Catch     Status: None   Collection Time: 11/14/22 11:59 AM  Result Value Ref Range   Color, Urine YELLOW YELLOW   APPearance CLEAR CLEAR   Specific Gravity, Urine 1.005 1.005 - 1.030   pH 6.0 5.0 - 8.0   Glucose, UA NEGATIVE NEGATIVE mg/dL   Hgb urine dipstick NEGATIVE NEGATIVE   Bilirubin Urine NEGATIVE NEGATIVE   Ketones, ur NEGATIVE NEGATIVE mg/dL   Protein, ur NEGATIVE NEGATIVE mg/dL   Nitrite NEGATIVE NEGATIVE  Leukocytes,Ua NEGATIVE NEGATIVE  hCG, quantitative, pregnancy     Status: Abnormal   Collection Time: 11/14/22  1:30 PM  Result Value Ref Range   hCG, Beta Chain, Quant, S 49 (H) <5 mIU/mL     US OB LESS THAN 14  WEEKS WITH OB TRANSVAGINAL  Result Date: 11/14/2022 CLINICAL DATA:  Pelvic pain in 1st trimester pregnancy. EXAM: OBSTETRIC <14 WK [place] AND TRANSVAGINAL OB US TECHNIQUE: Both transabdominal and transvaginal ultrasound examinations were performed for complete evaluation of the gestation as well as the maternal uterus, adnexal regions, and pelvic cul-de-sac. Transvaginal technique was performed to assess early pregnancy. COMPARISON:  None Available. FINDINGS: Intrauterine gestational sac: None Maternal uterus/adnexae: Uterus is retroverted. Endometrial thickness measures 6 mm. No fibroids identified. Left ovary is normal in appearance. A right ovarian corpus luteum is noted, but no other adnexal mass is identified. Small amount of complex fluid with low-level echoes is seen in the pelvic cul-de-sac. IMPRESSION: Pregnancy of unknown anatomic location (no intrauterine gestational sac or definite adnexal mass identified). Differential diagnosis includes recent spontaneous abortion, IUP too early to visualize, and non-visualized ectopic pregnancy. Recommend follow-up of beta-hCG levels, and follow up US as warranted clinically. Electronically Signed   By: Marlaine Hind M.D.   On: 11/14/2022 15:01     MDM Labs ordered and reviewed.   UA, UPT CBC, HCG ABO/Rh- O Pos Wet prep and gc/chlamydia US OB Comp Less 14 weeks with Transvaginal  Discussed with client the diagnosis of pregnancy of unknown anatomic location.  Three possibilities of outcome are: a healthy pregnancy that is too early to see a yolk sac to confirm the pregnancy is in the uterus, a pregnancy that is not healthy and has not developed and will not develop, and an ectopic pregnancy that is in the abdomen that cannot be identified at this time.  And ectopic pregnancy can be a life threatening situation as a pregnancy needs to be in the uterus which is a muscle and can stretch to accommodate the growth of a pregnancy.  Other structures in the pelvis and  abdomen as not muscular and do not stretch with the growth of a pregnancy.  Worst case scenario is that a structure ruptures with a growing pregnancy not in the uterus and and internal hemorrhage can be a life threatening situation.  We need to follow the progression of this pregnancy carefully.  We need to check another serum pregnancy hormone level to determine if the levels are rising appropriately  and to determine the next steps that are needed for you. Patient's questions were answered.  Assessment and Plan   1. Pregnancy of unknown anatomic location   2. [redacted] weeks gestation of pregnancy     -Discharge home in stable condition -Strict ectopic precautions discussed -Patient advised to follow-up with [facility] on 1/31 for repeat labs, appointment made -Patient may return to MAU as needed or if her condition were to change or worsen  Wende Mott, CNM 11/14/2022, 3:15 PM

## 2022-11-14 NOTE — Discharge Instructions (Signed)

## 2022-11-14 NOTE — ED Provider Triage Note (Signed)
Emergency Medicine Provider Triage Evaluation Note  Rhonda Waters , a 37 y.o. female  was evaluated in triage.  Pt complains of abdominal pain  Review of Systems  Positive: Pain   Negative: No fever  Physical Exam  BP 120/72 (BP Location: Right Arm)   Pulse 69   Temp 98.7 F (37.1 C)   Resp 17   Wt 94 kg   LMP 10/22/2022 (Exact Date)   SpO2 99%   BMI 39.16 kg/m  Gen:   Awake, no distress   Resp:  Normal effort  MSK:   Moves extremities without difficulty  Other:    Medical Decision Making  Medically screening exam initiated at 11:40 AM.  Appropriate orders placed.  Rhonda Waters was informed that the remainder of the evaluation will be completed by another provider, this initial triage assessment does not replace that evaluation, and the importance of remaining in the ED until their evaluation is complete.     Rhonda Waters, [place] 11/14/22 1141

## 2022-11-14 NOTE — ED Notes (Signed)
Patient to MAU

## 2022-11-14 NOTE — ED Triage Notes (Signed)
Pt complains of lower abd pain x 2 days. 2 days ago had vomiting and yesterday diarrhea. Denies any fever.unable to eat today. No meds PTA

## 2022-11-17 ENCOUNTER — Ambulatory Visit: Payer: Self-pay

## 2022-12-06 ENCOUNTER — Other Ambulatory Visit: Payer: Self-pay

## 2022-12-06 DIAGNOSIS — O3680X0 Pregnancy with inconclusive fetal viability, not applicable or unspecified: Secondary | ICD-10-CM

## 2022-12-07 ENCOUNTER — Telehealth: Payer: Self-pay

## 2022-12-07 DIAGNOSIS — O3680X0 Pregnancy with inconclusive fetal viability, not applicable or unspecified: Secondary | ICD-10-CM

## 2022-12-07 LAB — BETA HCG QUANT (REF LAB): hCG Quant: 16343 m[IU]/mL

## 2022-12-07 NOTE — Telephone Encounter (Addendum)
-----   Message from Rhonda Macadam, MD sent at 12/07/2022  1:26 PM EST ----- BHCG is rising appropriately. Patient needs to start prenatal care. Recommend [place] to confirm viability.   Called pt with Rhonda Waters, Rhonda Waters, and informed pt providers recommendation and the need for viability U/S.   Pt agreed to U/S scheduled on 12/28/22 @ 0930 for viability.    Rhonda Waters  12/07/22

## 2022-12-28 ENCOUNTER — Other Ambulatory Visit: Payer: Self-pay

## 2022-12-28 ENCOUNTER — Ambulatory Visit: Payer: Self-pay | Admitting: Advanced Practice Midwife

## 2023-01-06 ENCOUNTER — Encounter (HOSPITAL_COMMUNITY): Payer: Self-pay | Admitting: Obstetrics and Gynecology

## 2023-01-06 ENCOUNTER — Inpatient Hospital Stay (HOSPITAL_COMMUNITY): Payer: Self-pay

## 2023-01-06 ENCOUNTER — Inpatient Hospital Stay (HOSPITAL_COMMUNITY)
Admission: AD | Admit: 2023-01-06 | Discharge: 2023-01-07 | Disposition: A | Discharge status: Home / Self Care | Payer: Self-pay | Attending: Obstetrics and Gynecology | Admitting: Obstetrics and Gynecology

## 2023-01-06 DIAGNOSIS — K219 Gastro-esophageal reflux disease without esophagitis: Secondary | ICD-10-CM | POA: Insufficient documentation

## 2023-01-06 DIAGNOSIS — O209 Hemorrhage in early pregnancy, unspecified: Secondary | ICD-10-CM | POA: Insufficient documentation

## 2023-01-06 DIAGNOSIS — Z3A10 10 weeks gestation of pregnancy: Secondary | ICD-10-CM | POA: Insufficient documentation

## 2023-01-06 DIAGNOSIS — O09521 Supervision of elderly multigravida, first trimester: Secondary | ICD-10-CM | POA: Insufficient documentation

## 2023-01-06 DIAGNOSIS — R109 Unspecified abdominal pain: Secondary | ICD-10-CM | POA: Insufficient documentation

## 2023-01-06 DIAGNOSIS — O26899 Other specified pregnancy related conditions, unspecified trimester: Secondary | ICD-10-CM

## 2023-01-06 DIAGNOSIS — M549 Dorsalgia, unspecified: Secondary | ICD-10-CM | POA: Insufficient documentation

## 2023-01-06 DIAGNOSIS — O26891 Other specified pregnancy related conditions, first trimester: Secondary | ICD-10-CM | POA: Insufficient documentation

## 2023-01-06 DIAGNOSIS — O200 Threatened abortion: Secondary | ICD-10-CM

## 2023-01-06 DIAGNOSIS — Z3A11 11 weeks gestation of pregnancy: Secondary | ICD-10-CM

## 2023-01-06 LAB — URINALYSIS, ROUTINE W REFLEX MICROSCOPIC
Bilirubin Urine: NEGATIVE
Glucose, UA: 50 mg/dL — AB
Ketones, ur: NEGATIVE mg/dL
Nitrite: NEGATIVE
Protein, ur: NEGATIVE mg/dL
Specific Gravity, Urine: 1.021 (ref 1.005–1.030)
pH: 5 (ref 5.0–8.0)

## 2023-01-06 NOTE — MAU Provider Note (Cosign Needed Addendum)
History     CSN: [id number]  Arrival date and time: 01/06/23 2158   Event Date/Time   First Provider Initiated Contact with Patient 01/06/23 2243      Chief Complaint  Patient presents with   Vaginal Bleeding   Abdominal Pain   Back Pain   Rhonda Waters is a 37 y.o. [id number] at [redacted]w[redacted]d who presents today vaginal bleeding. She had an [place] very early in this pregnancy, but no confirmed IUP on [place] at this time.[place] was done here in MAU on 1/28. Then patient had FU HCG in the clinic on 12/06/2022. HCG was 16,343 at that time. Until today she has had no bleeding or any issues.   Vaginal Bleeding The patient's primary symptoms include vaginal bleeding. This is a new problem. The current episode started yesterday. The problem occurs constantly. The problem has been unchanged. The patient is experiencing no pain. The vaginal discharge was bloody. The vaginal bleeding is typical of menses. She has not been passing clots. She has not been passing tissue. Nothing aggravates the symptoms. She has tried nothing for the symptoms. Menstrual history: LMP 10/22/2022.    OB History     Gravida  5   Para  4   Term  4   Preterm      AB      Living  4      SAB      IAB      Ectopic      Multiple  0   Live Births  4           Past Medical History:  Diagnosis Date   GERD (gastroesophageal reflux disease)    Nausea & vomiting 10/20/2011    Past Surgical History:  Procedure Laterality Date   CHOLECYSTECTOMY  10/20/2012   Procedure: LAPAROSCOPIC CHOLECYSTECTOMY WITH INTRAOPERATIVE CHOLANGIOGRAM;  Surgeon: Earnstine Regal, MD;  Location: [place];  Service: General;  Laterality: N/A;    History reviewed. No pertinent family history.  Social History   Tobacco Use   Smoking status: Never   Smokeless tobacco: Never  Substance Use Topics   Alcohol use: No   Drug use: No    Allergies:  Allergies  Allergen Reactions   Other Rash    Sour cream    Medications Prior to  Admission  Medication Sig Dispense Refill Last Dose   Prenatal Vit-Fe Fumarate-FA (PREPLUS) 27-1 MG TABS Take 1 tablet by mouth daily. 30 tablet 13     Review of Systems  All other systems reviewed and are negative.  Physical Exam   Blood pressure 105/66, pulse 73, temperature 98.4 F (36.9 C), resp. rate 17, height 5\' 2"  (1.575 m), weight 81.2 kg, last menstrual period 10/22/2022, SpO2 100 %, unknown if currently breastfeeding.  Physical Exam Vitals and nursing note reviewed.  Constitutional:      General: She is not in acute distress. HENT:     Head: Normocephalic.  Eyes:     Pupils: Pupils are equal, round, and reactive to light.  Cardiovascular:     Rate and Rhythm: Normal rate.  Pulmonary:     Effort: Pulmonary effort is normal.  Skin:    General: Skin is warm and dry.  Neurological:     Mental Status: She is alert and oriented to person, place, and time.  Psychiatric:        Mood and Affect: Mood normal.        Behavior: Behavior normal.    Results  for orders placed or performed during the hospital encounter of 01/06/23 (from the past 24 hour(s))  Urinalysis, Routine w reflex microscopic -Urine, Clean Catch     Status: Abnormal   Collection Time: 01/06/23 10:15 PM  Result Value Ref Range   Color, Urine YELLOW YELLOW   APPearance CLOUDY (A) CLEAR   Specific Gravity, Urine 1.021 1.005 - 1.030   pH 5.0 5.0 - 8.0   Glucose, UA 50 (A) NEGATIVE mg/dL   Hgb urine dipstick LARGE (A) NEGATIVE   Bilirubin Urine NEGATIVE NEGATIVE   Ketones, ur NEGATIVE NEGATIVE mg/dL   Protein, ur NEGATIVE NEGATIVE mg/dL   Nitrite NEGATIVE NEGATIVE   Leukocytes,Ua LARGE (A) NEGATIVE   RBC / HPF 6-10 0 - 5 RBC/hpf   WBC, UA 21-50 0 - 5 WBC/hpf   Bacteria, UA RARE (A) NONE SEEN   Squamous Epithelial / HPF 21-50 0 - 5 /HPF   Mucus PRESENT    US OB Transvaginal  Addendum Date: 01/07/2023   ADDENDUM REPORT: 01/07/2023 00:10 ADDENDUM: The exam was compared to prior ultrasound examination  of November 14, 2022. The gestational age based on LMP is 10 weeks and 6 days. The findings are concerning for abnormal gestation, short-term follow-up examination in 48-72 hours and correlation with serial beta HCG is recommended. Above findings were discussed with midwife Heather at approximately 12:03 a.m. on 01/07/2023. Electronically Signed   By: Keane Police D.O.   On: 01/07/2023 00:10   Result Date: 01/07/2023 CLINICAL DATA:  Abdominal pain and spotting. EXAM: OBSTETRIC <14 WK [place] AND TRANSVAGINAL OB US TECHNIQUE: Transvaginal ultrasound examinations was performed for complete evaluation of the gestation as well as the maternal uterus, adnexal regions, and pelvic cul-de-sac. COMPARISON:  None Available. FINDINGS: Intrauterine gestational sac: Single Yolk sac:  Not Visualized. Embryo:  Not Visualized. MSD: 18.6 mm   6 w   6 d Maternal uterus/adnexae: Right ovary is normal. Left ovary is not clearly visualized. IMPRESSION: 1. Gestational sac without yolk sac or fetal pole with mean sac diameter of 18.6 mm, corresponding to a gestational age of [redacted] weeks and 6 days. This may represent early normal or early abnormal gestation. Correlate with serial beta HCG and follow-up sonogram as clinically warranted. 2. Right ovary is normal, left ovary not clearly visualized. Electronically Signed: By: Keane Police D.O. On: 01/06/2023 23:14    MAU Course  Procedures  MDM 0100 care turned over to Hansel Feinstein, CNM  HCG pending   Results for orders placed or performed during the hospital encounter of 01/06/23 (from the past 24 hour(s))  Urinalysis, Routine w reflex microscopic -Urine, Clean Catch     Status: Abnormal   Collection Time: 01/06/23 10:15 PM  Result Value Ref Range   Color, Urine YELLOW YELLOW   APPearance CLOUDY (A) CLEAR   Specific Gravity, Urine 1.021 1.005 - 1.030   pH 5.0 5.0 - 8.0   Glucose, UA 50 (A) NEGATIVE mg/dL   Hgb urine dipstick LARGE (A) NEGATIVE   Bilirubin Urine NEGATIVE NEGATIVE    Ketones, ur NEGATIVE NEGATIVE mg/dL   Protein, ur NEGATIVE NEGATIVE mg/dL   Nitrite NEGATIVE NEGATIVE   Leukocytes,Ua LARGE (A) NEGATIVE   RBC / HPF 6-10 0 - 5 RBC/hpf   WBC, UA 21-50 0 - 5 WBC/hpf   Bacteria, UA RARE (A) NONE SEEN   Squamous Epithelial / HPF 21-50 0 - 5 /HPF   Mucus PRESENT   hCG, quantitative, pregnancy     Status: Abnormal  Collection Time: 01/07/23 12:21 AM  Result Value Ref Range   hCG, Beta Chain, Quant, S 11,880 (H) <5 mIU/mL  CBC     Status: Abnormal   Collection Time: 01/07/23 12:21 AM  Result Value Ref Range   WBC 10.7 (H) 4.0 - 10.5 K/uL   RBC 3.82 (L) 3.87 - 5.11 MIL/uL   Hemoglobin 10.8 (L) 12.0 - 15.0 g/dL   HCT 32.6 (L) 36.0 - 46.0 %   MCV 85.3 80.0 - 100.0 fL   MCH 28.3 26.0 - 34.0 pg   MCHC 33.1 30.0 - 36.0 g/dL   RDW 14.2 11.5 - 15.5 %   Platelets 274 150 - 400 K/uL   nRBC 0.0 0.0 - 0.2 %   This reflects a small drop in HCG since a month ago Discussed with Dr Elgie Congo who recommends getting one more HCG level on Monday (message sent) Discussed findings with patient that this is likely a nonviable pregnancy but that we want to do one more HCG before offering Cytotec. Assessment and Plan  Pregnancy at [redacted]w[redacted]d by LMP No development of fetal pole Slight decrease in HCG level  Discharge home Repeat HCG Monday per Dr Elgie Congo SAB precautions Encouraged to return if she develops worsening of symptoms, increase in pain, fever, or other concerning symptoms.   Seabron Spates, CNM

## 2023-01-06 NOTE — MAU Note (Addendum)
.  Rhonda Waters is a 37 y.o. at [redacted]w[redacted]d here in MAU reporting lower abdominal and lower back pain since 1100. Had some spotting today as well. Pain is cramping and intermittent. No recent intercourse  Onset of complaint: 1100 Pain score: 7 Vitals:   01/06/23 2207  BP: 105/66  Pulse: 73  Resp: 17  Temp: 98.4 F (36.9 C)  SpO2: 100%     FHT:n/a Lab orders placed from triage:  u/a

## 2023-01-07 DIAGNOSIS — O26891 Other specified pregnancy related conditions, first trimester: Secondary | ICD-10-CM

## 2023-01-07 DIAGNOSIS — Z3A11 11 weeks gestation of pregnancy: Secondary | ICD-10-CM

## 2023-01-07 DIAGNOSIS — R109 Unspecified abdominal pain: Secondary | ICD-10-CM

## 2023-01-07 DIAGNOSIS — O209 Hemorrhage in early pregnancy, unspecified: Secondary | ICD-10-CM

## 2023-01-07 LAB — CBC
HCT: 32.6 % — ABNORMAL LOW (ref 36.0–46.0)
Hemoglobin: 10.8 g/dL — ABNORMAL LOW (ref 12.0–15.0)
MCH: 28.3 pg (ref 26.0–34.0)
MCHC: 33.1 g/dL (ref 30.0–36.0)
MCV: 85.3 fL (ref 80.0–100.0)
Platelets: 274 10*3/uL (ref 150–400)
RBC: 3.82 MIL/uL — ABNORMAL LOW (ref 3.87–5.11)
RDW: 14.2 % (ref 11.5–15.5)
WBC: 10.7 10*3/uL — ABNORMAL HIGH (ref 4.0–10.5)
nRBC: 0 % (ref 0.0–0.2)

## 2023-01-07 LAB — HCG, QUANTITATIVE, PREGNANCY: hCG, Beta Chain, Quant, S: 11880 m[IU]/mL — ABNORMAL HIGH (ref <5)

## 2023-01-10 ENCOUNTER — Other Ambulatory Visit: Payer: Self-pay

## 2023-01-10 DIAGNOSIS — O039 Complete or unspecified spontaneous abortion without complication: Secondary | ICD-10-CM

## 2023-01-11 ENCOUNTER — Inpatient Hospital Stay (HOSPITAL_COMMUNITY): Payer: Self-pay

## 2023-01-11 ENCOUNTER — Inpatient Hospital Stay (HOSPITAL_COMMUNITY)
Admission: AD | Admit: 2023-01-11 | Discharge: 2023-01-12 | Disposition: A | Discharge status: Home / Self Care | Payer: Self-pay | Attending: Obstetrics & Gynecology | Admitting: Obstetrics & Gynecology

## 2023-01-11 DIAGNOSIS — O020 Blighted ovum and nonhydatidiform mole: Secondary | ICD-10-CM

## 2023-01-11 DIAGNOSIS — Z6790 Unspecified blood type, Rh positive: Secondary | ICD-10-CM

## 2023-01-11 LAB — CBC
HCT: 33.6 % — ABNORMAL LOW (ref 36.0–46.0)
Hemoglobin: 11.3 g/dL — ABNORMAL LOW (ref 12.0–15.0)
MCH: 28.5 pg (ref 26.0–34.0)
MCHC: 33.6 g/dL (ref 30.0–36.0)
MCV: 84.6 fL (ref 80.0–100.0)
Platelets: 295 10*3/uL (ref 150–400)
RBC: 3.97 MIL/uL (ref 3.87–5.11)
RDW: 14 % (ref 11.5–15.5)
WBC: 11.5 10*3/uL — ABNORMAL HIGH (ref 4.0–10.5)
nRBC: 0 % (ref 0.0–0.2)

## 2023-01-11 LAB — BETA HCG QUANT (REF LAB): hCG Quant: 6978 m[IU]/mL

## 2023-01-11 MED ORDER — ACETAMINOPHEN 500 MG PO TABS
1000.0000 mg | ORAL_TABLET | Freq: Four times a day (QID) | ORAL | Status: DC | PRN
  Administered 2023-01-11: 1000 mg via ORAL
  Filled 2023-01-11: qty 2

## 2023-01-11 NOTE — MAU Note (Signed)
Rhonda Waters CNM in Triage to see pt and discuss plan of care. Rhonda Waters, in house spanish interpreter helping CNM

## 2023-01-11 NOTE — MAU Note (Signed)
Pt was teary eyed when she returned from u/s. Sitting in Family Rm with her daughter who speaks [place]

## 2023-01-11 NOTE — MAU Note (Addendum)
.  Rhonda Waters is a 37 y.o. at [redacted]w[redacted]d here in MAU reporting vaginal bleeding since 0900. Has used 2 pads today. Having some abdominal cramping  Onset of complaint: This am Pain score: 7 Vitals:   01/11/23 2118 01/11/23 2120  BP:  116/69  Pulse: 72   Resp: 17   Temp: 98.5 F (36.9 C)   SpO2: 100%      FHT:n/a Lab orders placed from triage:  none

## 2023-01-11 NOTE — MAU Provider Note (Signed)
History     CSN: [id number]  Arrival date and time: 01/11/23 2105   Event Date/Time   First Provider Initiated Contact with Patient 01/11/23 2258      Chief Complaint  Patient presents with   Vaginal Bleeding   37 y.o. [id number] @11 .4 wks by LMP presenting with VB. Reprots onset of bleeding around 9am. She has sed 2 pads today and seeing blood when she wipes and in the toilet. Reports LAP. Rates pain 7/10. Has not taken anything for it. Of note, she was seen in MAU 5 days ago and had qhcg of 11,880 and only IUGS on [place]. She previously had qhcg of 16,343 about 1 month ago. Her qhcg was repeated yesterday and was 6978.    OB History     Gravida  5   Para  4   Term  4   Preterm      AB      Living  4      SAB      IAB      Ectopic      Multiple  0   Live Births  4           Past Medical History:  Diagnosis Date   GERD (gastroesophageal reflux disease)    Nausea & vomiting 10/20/2011    Past Surgical History:  Procedure Laterality Date   CHOLECYSTECTOMY  10/20/2012   Procedure: LAPAROSCOPIC CHOLECYSTECTOMY WITH INTRAOPERATIVE CHOLANGIOGRAM;  Surgeon: Earnstine Regal, MD;  Location: [place];  Service: General;  Laterality: N/A;    No family history on file.  Social History   Tobacco Use   Smoking status: Never   Smokeless tobacco: Never  Substance Use Topics   Alcohol use: No   Drug use: No    Allergies:  Allergies  Allergen Reactions   Other Rash    Sour cream    Medications Prior to Admission  Medication Sig Dispense Refill Last Dose   Prenatal Vit-Fe Fumarate-FA (PREPLUS) 27-1 MG TABS Take 1 tablet by mouth daily. 30 tablet 13     Review of Systems Physical Exam   Blood pressure 116/69, pulse 72, temperature 98.5 F (36.9 C), resp. rate 17, height 5\' 2"  (1.575 m), weight 80.7 kg, last menstrual period 10/22/2022, SpO2 100 %, unknown if currently breastfeeding.  Physical Exam  Results for orders placed or performed during the hospital  encounter of 01/11/23 (from the past 24 hour(s))  CBC     Status: Abnormal   Collection Time: 01/11/23 11:31 PM  Result Value Ref Range   WBC 11.5 (H) 4.0 - 10.5 K/uL   RBC 3.97 3.87 - 5.11 MIL/uL   Hemoglobin 11.3 (L) 12.0 - 15.0 g/dL   HCT 33.6 (L) 36.0 - 46.0 %   MCV 84.6 80.0 - 100.0 fL   MCH 28.5 26.0 - 34.0 pg   MCHC 33.6 30.0 - 36.0 g/dL   RDW 14.0 11.5 - 15.5 %   Platelets 295 150 - 400 K/uL   nRBC 0.0 0.0 - 0.2 %  hCG, quantitative, pregnancy     Status: Abnormal   Collection Time: 01/11/23 11:31 PM  Result Value Ref Range   hCG, Beta Chain, Quant, S 7,238 (H) <5 mIU/mL   US OB Transvaginal  Result Date: 01/11/2023 CLINICAL DATA:  Vaginal bleeding with known positive pregnancy EXAM: TRANSVAGINAL OB ULTRASOUND TECHNIQUE: Transvaginal ultrasound was performed for complete evaluation of the gestation as well as the maternal uterus, adnexal regions, and pelvic cul-de-sac. COMPARISON:  01/06/2023 FINDINGS: Intrauterine gestational sac: Present Yolk sac:  Present Embryo:  Absent MSD: 19.3 mm   6 w   6 d Subchorionic hemorrhage:  Small subchorionic hemorrhage is noted. Maternal uterus/adnexae: Corpus luteum is noted within the right ovary. Left ovary is again not well visualized. IMPRESSION: Probable early intrauterine gestational sac with yolk sac but no fetal pole, or cardiac activity yet visualized. Recommend follow-up quantitative B-HCG levels and follow-up US in 14 days to assess viability. This recommendation follows SRU consensus guidelines: Diagnostic Criteria for Nonviable Pregnancy Early in the First Trimester. [employer] Med [id number]. the overall appearance is roughly stable from the prior exam. Follow-up is recommended as clinically indicated following correlation with beta HCG levels. Electronically Signed   By: Inez Catalina M.D.   On: 01/11/2023 23:56    MAU Course  Procedures  MDM Labs and [place] ordered and reviewed. IUGS and YS seen but no FP on [place] today and  essentially no rise in qhcg. Discussed findings of failed pregnancy with pt and daughter. Options for mngt include expectant vs medical vs surgical. She opts for expectant at this time. Informed she could call the clinic if she decides on a different plan for mngt. Stable for discharge.  Assessment and Plan   1. Anembryonic pregnancy   2. Blood type, Rh positive    Discharge home Follow up at [facility] in 1 week-message sent Bleeding/pain return precautions  Allergies as of 01/12/2023       Reactions   Other Rash   Sour cream        Medication List     TAKE these medications    PrePLUS 27-1 MG Tabs Take 1 tablet by mouth daily.        Live and video interpreter used for encounter Julianne Handler, CNM 01/12/2023, 1:04 AM

## 2023-01-12 ENCOUNTER — Encounter: Payer: Self-pay | Admitting: Advanced Practice Midwife

## 2023-01-12 DIAGNOSIS — O021 Missed abortion: Secondary | ICD-10-CM | POA: Insufficient documentation

## 2023-01-12 DIAGNOSIS — Z3A11 11 weeks gestation of pregnancy: Secondary | ICD-10-CM

## 2023-01-12 DIAGNOSIS — O020 Blighted ovum and nonhydatidiform mole: Secondary | ICD-10-CM

## 2023-01-12 DIAGNOSIS — Z6790 Unspecified blood type, Rh positive: Secondary | ICD-10-CM

## 2023-01-12 HISTORY — DX: Missed abortion: O02.1

## 2023-01-12 LAB — HCG, QUANTITATIVE, PREGNANCY: hCG, Beta Chain, Quant, S: 7238 m[IU]/mL — ABNORMAL HIGH (ref <5)

## 2023-01-12 NOTE — Progress Notes (Signed)
Julianne Handler CNM in earlier to discuss d/c plan. Video interpreter used. Written and verbal d/c instructions given and understanding voiced

## 2023-01-12 NOTE — MAU Note (Signed)
Julianne Handler CNM in to see pt and discuss test results and plan of care. Using video interpreter to talk with pt

## 2023-01-19 NOTE — Progress Notes (Signed)
GYNECOLOGY OFFICE VISIT NOTE  History:   Rhonda Waters is a 37 y.o. [id number] here today for possible miscarriage. She was seen in MAU 3/26 for vaginal bleeding due to threatened miscarriage. She continues to have vaginal bleeding. She states she was not told the beta HCG was decreasing and does not desire intervention at this time.  She denies fever, increasing abdominal pain, or malodorous discharge. She states she has seen a few clot but believes her bleeding is the same since she was in the MAU.    Past Medical History:  Diagnosis Date   GERD (gastroesophageal reflux disease)    Nausea & vomiting 10/20/2011    Past Surgical History:  Procedure Laterality Date   CHOLECYSTECTOMY  10/20/2012   Procedure: LAPAROSCOPIC CHOLECYSTECTOMY WITH INTRAOPERATIVE CHOLANGIOGRAM;  Surgeon: Velora Hecklerodd M Gerkin, MD;  Location: [facility] OR;  Service: General;  Laterality: N/A;    Physical Exam:  BP (!) 113/52   Pulse 79   Ht 5\' 2"  (1.575 m)   Wt 81.2 kg   LMP 10/22/2022 (Exact Date)   Breastfeeding Unknown   BMI 32.74 kg/m  General: Appears well, no acute distress. Age appropriate. Cardiac: RRR, normal heart sounds, no murmurs Respiratory: CTAB, normal effort Abdomen: soft, nontender, nondistended Skin: Warm and dry, no rashes noted Neuro: alert and oriented, no focal deficits Psych: normal affect Pelvic: Deferred  Labs and Imaging  US OB Transvaginal  Result Date: 01/11/2023 CLINICAL DATA:  Vaginal bleeding with known positive pregnancy EXAM: TRANSVAGINAL OB ULTRASOUND TECHNIQUE: Transvaginal ultrasound was performed for complete evaluation of the gestation as well as the maternal uterus, adnexal regions, and pelvic cul-de-sac. COMPARISON:  01/06/2023 FINDINGS: Intrauterine gestational sac: Present Yolk sac:  Present Embryo:  Absent MSD: 19.3 mm   6 w   6 d Subchorionic hemorrhage:  Small subchorionic hemorrhage is noted. Maternal uterus/adnexae: Corpus luteum is noted within the right ovary.  Left ovary is again not well visualized. IMPRESSION: Probable early intrauterine gestational sac with yolk sac but no fetal pole, or cardiac activity yet visualized. Recommend follow-up quantitative B-HCG levels and follow-up US in 14 days to assess viability. This recommendation follows SRU consensus guidelines: Diagnostic Criteria for Nonviable Pregnancy Early in the First Trimester. [name] Engl J Med 2013; 454:0981-19; 369:1443-51. the overall appearance is roughly stable from the prior exam. Follow-up is recommended as clinically indicated following correlation with beta HCG levels. Electronically Signed   By: Alcide CleverMark  Lukens M.D.   On: 01/11/2023 23:56   US OB Transvaginal  Addendum Date: 01/07/2023   ADDENDUM REPORT: 01/07/2023 00:10 ADDENDUM: The exam was compared to prior ultrasound examination of November 14, 2022. The gestational age based on LMP is 10 weeks and 6 days. The findings are concerning for abnormal gestation, short-term follow-up examination in 48-72 hours and correlation with serial beta HCG is recommended. Above findings were discussed with midwife Heather at approximately 12:03 a.m. on 01/07/2023. Electronically Signed   By: Larose HiresImran  Ahmed D.O.   On: 01/07/2023 00:10   Result Date: 01/07/2023 CLINICAL DATA:  Abdominal pain and spotting. EXAM: OBSTETRIC <14 WK US AND TRANSVAGINAL OB US TECHNIQUE: Transvaginal ultrasound examinations was performed for complete evaluation of the gestation as well as the maternal uterus, adnexal regions, and pelvic cul-de-sac. COMPARISON:  None Available. FINDINGS: Intrauterine gestational sac: Single Yolk sac:  Not Visualized. Embryo:  Not Visualized. MSD: 18.6 mm   6 w   6 d Maternal uterus/adnexae: Right ovary is normal. Left ovary is not clearly visualized. IMPRESSION: 1.  Gestational sac without yolk sac or fetal pole with mean sac diameter of 18.6 mm, corresponding to a gestational age of [redacted] weeks and 6 days. This may represent early normal or early abnormal gestation.  Correlate with serial beta HCG and follow-up sonogram as clinically warranted. 2. Right ovary is normal, left ovary not clearly visualized. Electronically Signed: By: Larose HiresImran  Ahmed D.O. On: 01/06/2023 23:14      Assessment and Plan:  1. Threatened miscarriage Clinically stable. IUP without fetal pole on last US.  -TVUS follow up in 1 week - Beta hCG quant (ref lab) -MAU precautions given   No follow-ups on file.    Lavonda JumboSimone Autry-Lott, DO OB Fellow, Faculty [facility] [place], Center [facility] 01/21/2023, 8:53 PM

## 2023-01-20 ENCOUNTER — Other Ambulatory Visit: Payer: Self-pay

## 2023-01-20 ENCOUNTER — Ambulatory Visit (INDEPENDENT_AMBULATORY_CARE_PROVIDER_SITE_OTHER): Payer: Self-pay | Admitting: Family Medicine

## 2023-01-20 ENCOUNTER — Encounter: Payer: Self-pay | Admitting: Family Medicine

## 2023-01-20 VITALS — BP 113/52 | HR 79 | Ht 62.0 in | Wt 179.0 lb

## 2023-01-20 DIAGNOSIS — Z3A11 11 weeks gestation of pregnancy: Secondary | ICD-10-CM

## 2023-01-20 DIAGNOSIS — O200 Threatened abortion: Secondary | ICD-10-CM

## 2023-01-21 LAB — BETA HCG QUANT (REF LAB): hCG Quant: 1776 m[IU]/mL

## 2023-01-26 ENCOUNTER — Ambulatory Visit (HOSPITAL_COMMUNITY)
Admission: RE | Admit: 2023-01-26 | Discharge: 2023-01-26 | Disposition: A | Discharge status: Home / Self Care | Payer: Self-pay | Source: Ambulatory Visit | Attending: Family Medicine | Admitting: Family Medicine

## 2023-01-26 DIAGNOSIS — O3680X0 Pregnancy with inconclusive fetal viability, not applicable or unspecified: Secondary | ICD-10-CM | POA: Insufficient documentation

## 2023-01-31 ENCOUNTER — Telehealth: Payer: Self-pay | Admitting: Family Medicine

## 2023-01-31 DIAGNOSIS — O034 Incomplete spontaneous abortion without complication: Secondary | ICD-10-CM

## 2023-01-31 NOTE — Telephone Encounter (Signed)
Called the patient with Spanish interpreter and discussed incomplete miscarriage as diagnosed by ultrasound and blood work results. She is continuing to have some abdominal pain and vaginal bleeding. She denies fever. Ideally would like to get her scheduled with OBGYN to discuss D&E/medical treatment/expectant managment due to language barrier.   Lavonda Jumbo, DO OB Fellow, Faculty [facility], Center for [facility] 01/31/2023, 7:57 AM

## 2023-02-02 ENCOUNTER — Telehealth: Payer: Self-pay | Admitting: Obstetrics and Gynecology

## 2023-02-02 NOTE — Telephone Encounter (Signed)
Spoke with patient using an interpreter about her appointment.

## 2023-02-07 ENCOUNTER — Encounter: Payer: Self-pay | Admitting: Obstetrics and Gynecology

## 2023-02-07 ENCOUNTER — Ambulatory Visit (INDEPENDENT_AMBULATORY_CARE_PROVIDER_SITE_OTHER): Payer: Self-pay | Admitting: Obstetrics and Gynecology

## 2023-02-07 ENCOUNTER — Other Ambulatory Visit: Payer: Self-pay

## 2023-02-07 VITALS — BP 109/86 | HR 63 | Ht 61.0 in | Wt 184.5 lb

## 2023-02-07 DIAGNOSIS — Z3A01 Less than 8 weeks gestation of pregnancy: Secondary | ICD-10-CM

## 2023-02-07 DIAGNOSIS — O021 Missed abortion: Secondary | ICD-10-CM

## 2023-02-07 NOTE — Progress Notes (Signed)
Ms Rhonda Waters presents for f/u of incomplete AB. See prior office notes. Pt reports still having some degree of bleeding daily. No fever or pain  Last U/S results and BHCG reviewed with pt  Blood type O +  PE AF VSS Lungs clear Heart RRR Abd soft + BS GU deferred  A/P Incomplete AB  Tx options reviewed with pt She has not had a BHCG since 4/4 Will repeat today and pending results additional Tx as indicated Discussed medical vs surgerical management with pt if need. Pt desires medical management Live interrupter used during today's visit

## 2023-02-08 ENCOUNTER — Encounter: Payer: Self-pay | Admitting: Obstetrics and Gynecology

## 2023-02-08 ENCOUNTER — Telehealth: Payer: Self-pay

## 2023-02-08 DIAGNOSIS — O039 Complete or unspecified spontaneous abortion without complication: Secondary | ICD-10-CM

## 2023-02-08 LAB — BETA HCG QUANT (REF LAB): hCG Quant: 89 m[IU]/mL

## 2023-02-08 NOTE — Telephone Encounter (Addendum)
-----   Message from Hermina Staggers, MD sent at 02/08/2023  9:47 AM EDT ----- Please let pt know that her pregnancy hormonal level is down to 89. Recommend repeat level in 2 weeks, non stat. No need for any additional treatment at this time.  Thanks Casimiro Needle   Left message with Debarah Crape, spanish interpreter, to call the office for results and follow up.  Addison Naegeli, RN  02/08/23

## 2023-02-09 NOTE — Telephone Encounter (Signed)
Called pt with Rhonda R., and left message that this is our second attempt please return call to the office.  A letter will also be sent.  Letter sent.   [name]

## 2023-02-09 NOTE — Addendum Note (Signed)
Addended by: Faythe Casa on: 02/09/2023 02:20 PM   Modules accepted: Orders

## 2023-02-09 NOTE — Telephone Encounter (Signed)
Pt notified beta is decreasing as it should and we need to trend to zero.  Pt stated that she will be able to come in on 02/25/23 @ 1000 for non stat beta.    [name]  02/09/23

## 2023-02-22 ENCOUNTER — Encounter: Payer: Self-pay | Admitting: Obstetrics & Gynecology

## 2023-02-25 ENCOUNTER — Other Ambulatory Visit: Payer: Self-pay

## 2023-02-25 DIAGNOSIS — O039 Complete or unspecified spontaneous abortion without complication: Secondary | ICD-10-CM

## 2023-02-26 LAB — BETA HCG QUANT (REF LAB): hCG Quant: 10 m[IU]/mL

## 2023-02-28 ENCOUNTER — Telehealth: Payer: Self-pay | Admitting: General Practice

## 2023-02-28 DIAGNOSIS — O021 Missed abortion: Secondary | ICD-10-CM

## 2023-02-28 NOTE — Telephone Encounter (Signed)
Called patient with Rhonda Waters assisting with spanish interpretation & informed her of results. Lab appt scheduled for 5/28 @ 230. Patient verbalized understanding to all.

## 2023-02-28 NOTE — Telephone Encounter (Signed)
-----   Message from Hermina Staggers, MD sent at 02/27/2023 11:07 AM EDT ----- Please let pt know that her BHCG continues to decrease. This is good news Repeat BHCG non stat in 2 weeks Thanks Casimiro Needle

## 2023-03-15 ENCOUNTER — Other Ambulatory Visit: Payer: Self-pay

## 2023-03-15 ENCOUNTER — Encounter: Payer: Self-pay | Admitting: Family Medicine

## 2023-10-19 NOTE — L&D Delivery Note (Signed)
 OB/GYN Faculty Practice Delivery Note  Rhonda Waters is a 38 y.o. [id number] s/p SVD at [redacted]w[redacted]d. She was admitted for SROM and SOL.   ROM: Patient reports ROM in the waiting room with reported clear fluid.  GBS Status: Negative/-- (08/07 1652)     Labor Progress: Initial SVE: 5/90/-2. She then progressed to complete.   Delivery Date/Time: 05/31/2024 @ 22:26 Delivery: Called to room and patient was 8.5cm dilated, she quickly progressed to complete in a matter of minutes. She pushed twice and head delivered LOA. No nuchal cord present. Shoulder and body delivered in usual fashion. Infant with spontaneous cry, placed on mother's abdomen, dried and stimulated. Cord clamped x 2 after 1-minute delay, and cut by MOB. Cord blood drawn. Placenta delivered spontaneously with gentle cord traction. Fundus firm with massage and Pitocin . TXA administered Labia, perineum, vagina, and cervix inspected inspected with mild right labial abrasions which were hemostatic.  Baby Weight: pending  Placenta: Sent to L&D Complications: None Lacerations:  EBL: 100 mL Analgesia: Epidural   Infant:  APGAR (1 MIN):  9 APGAR (5 MINS):  9

## 2023-10-30 ENCOUNTER — Inpatient Hospital Stay (HOSPITAL_COMMUNITY): Payer: Self-pay

## 2023-10-30 ENCOUNTER — Encounter (HOSPITAL_COMMUNITY): Payer: Self-pay

## 2023-10-30 ENCOUNTER — Inpatient Hospital Stay (HOSPITAL_COMMUNITY)
Admission: AD | Admit: 2023-10-30 | Discharge: 2023-10-30 | Disposition: A | Discharge status: Home / Self Care | Payer: Self-pay | Attending: Obstetrics and Gynecology | Admitting: Obstetrics and Gynecology

## 2023-10-30 DIAGNOSIS — O09521 Supervision of elderly multigravida, first trimester: Secondary | ICD-10-CM | POA: Insufficient documentation

## 2023-10-30 DIAGNOSIS — O418X10 Other specified disorders of amniotic fluid and membranes, first trimester, not applicable or unspecified: Secondary | ICD-10-CM

## 2023-10-30 DIAGNOSIS — O468X1 Other antepartum hemorrhage, first trimester: Secondary | ICD-10-CM

## 2023-10-30 DIAGNOSIS — Z3A01 Less than 8 weeks gestation of pregnancy: Secondary | ICD-10-CM | POA: Insufficient documentation

## 2023-10-30 DIAGNOSIS — O209 Hemorrhage in early pregnancy, unspecified: Secondary | ICD-10-CM

## 2023-10-30 DIAGNOSIS — O200 Threatened abortion: Secondary | ICD-10-CM | POA: Insufficient documentation

## 2023-10-30 DIAGNOSIS — Z3490 Encounter for supervision of normal pregnancy, unspecified, unspecified trimester: Secondary | ICD-10-CM

## 2023-10-30 DIAGNOSIS — O208 Other hemorrhage in early pregnancy: Secondary | ICD-10-CM | POA: Insufficient documentation

## 2023-10-30 LAB — WET PREP, GENITAL
Clue Cells Wet Prep HPF POC: NONE SEEN
Sperm: NONE SEEN
Trich, Wet Prep: NONE SEEN
WBC, Wet Prep HPF POC: <10 (ref <10)
Yeast Wet Prep HPF POC: NONE SEEN

## 2023-10-30 LAB — URINALYSIS, ROUTINE W REFLEX MICROSCOPIC
Bilirubin Urine: NEGATIVE
Glucose, UA: NEGATIVE mg/dL
Ketones, ur: NEGATIVE mg/dL
Leukocytes,Ua: NEGATIVE
Nitrite: NEGATIVE
Protein, ur: NEGATIVE mg/dL
Specific Gravity, Urine: 1.008 (ref 1.005–1.030)
pH: 8 (ref 5.0–8.0)

## 2023-10-30 LAB — CBC
HCT: 35.2 % — ABNORMAL LOW (ref 36.0–46.0)
Hemoglobin: 12.1 g/dL (ref 12.0–15.0)
MCH: 29.2 pg (ref 26.0–34.0)
MCHC: 34.4 g/dL (ref 30.0–36.0)
MCV: 84.8 fL (ref 80.0–100.0)
Platelets: 311 10*3/uL (ref 150–400)
RBC: 4.15 MIL/uL (ref 3.87–5.11)
RDW: 13.8 % (ref 11.5–15.5)
WBC: 10.6 10*3/uL — ABNORMAL HIGH (ref 4.0–10.5)
nRBC: 0 % (ref 0.0–0.2)

## 2023-10-30 LAB — POCT PREGNANCY, URINE: Preg Test, Ur: POSITIVE — AB

## 2023-10-30 LAB — HCG, QUANTITATIVE, PREGNANCY: hCG, Beta Chain, Quant, S: 44233 m[IU]/mL — ABNORMAL HIGH (ref <5)

## 2023-10-30 NOTE — MAU Note (Signed)
.  Rhonda Waters is a 38 y.o. at [redacted]w[redacted]d according to LMP here in MAU reporting: Light red vaginal bleeding noted with wiping that began this morning around 0900. She reports while in the MAU lobby she began experiencing intermittent lower abdominal cramps that she rates a 5/10. Denies vaginal itching and vaginal odors. Regular periods.  Has her 26 y/o daughter with her. Desires daughter to be with her for assessments and results.  LMP: 09/12/2023 Onset of complaint: 0900 today Pain score: 5/10 lower abdomen  Vitals:   10/30/23 1542  BP: 124/80  Pulse: 80  Resp: 16  Temp: 98.1 F (36.7 C)  SpO2: 100%     FHT: n/a Lab orders placed from triage: POCT Preg

## 2023-10-30 NOTE — MAU Provider Note (Signed)
 History     CSN: [id number]  Arrival date and time: 10/30/23 1528   Event Date/Time   First Provider Initiated Contact with Patient 10/30/23 1621      Chief Complaint  Patient presents with   Vaginal Bleeding   Abdominal Pain   HPI [name] is a 38 y.o. year old G35P4014 female at [redacted]w[redacted]d weeks gestation who presents to MAU reporting light red vaginal bleeding with wiping that started at 9 AM today.  She also reports intermittent lower abdominal cramping that started while she was in MAU lobby; rated 5/10.  She denies any vaginal itching or vaginal odors.  She denies any recent sexual intercourse.  She has not started prenatal care at this time. Her daughter is present and contributing to the history taking.   OB History     Gravida  6   Para  4   Term  4   Preterm      AB  1   Living  4      SAB  1   IAB      Ectopic      Multiple  0   Live Births  4           Past Medical History:  Diagnosis Date   GERD (gastroesophageal reflux disease)    Nausea & vomiting 10/20/2011    Past Surgical History:  Procedure Laterality Date   CHOLECYSTECTOMY  10/20/2012   Procedure: LAPAROSCOPIC CHOLECYSTECTOMY WITH INTRAOPERATIVE CHOLANGIOGRAM;  Surgeon: Krystal CHRISTELLA Spinner, MD;  Location: [facility] OR;  Service: General;  Laterality: N/A;    History reviewed. No pertinent family history.  Social History   Tobacco Use   Smoking status: Never   Smokeless tobacco: Never  Substance Use Topics   Alcohol use: No   Drug use: No    Allergies:  Allergies  Allergen Reactions   Other Rash    Sour cream    No medications prior to admission.    Review of Systems  Constitutional: Negative.   HENT: Negative.    Eyes: Negative.   Respiratory: Negative.    Cardiovascular: Negative.   Gastrointestinal: Negative.   Endocrine: Negative.   Genitourinary:  Positive for pelvic pain (lower, intermittent cramping since arriving in MAU) and vaginal bleeding (light,  red with wiping since 0900 today).  Musculoskeletal: Negative.   Skin: Negative.   Allergic/Immunologic: Negative.   Neurological: Negative.   Hematological: Negative.   Psychiatric/Behavioral: Negative.     Physical Exam   Blood pressure 124/80, pulse 80, temperature 98.1 F (36.7 C), temperature source Oral, resp. rate 16, height 5' 1 (1.549 m), weight 85.1 kg, last menstrual period 09/12/2023, SpO2 100%, unknown if currently breastfeeding.  Physical Exam Vitals and nursing note reviewed.  Constitutional:      Appearance: Normal appearance. She is obese.  Cardiovascular:     Rate and Rhythm: Normal rate.  Pulmonary:     Effort: Pulmonary effort is normal.  Genitourinary:    Comments: Swabs collected by patient using blind swab technique  Musculoskeletal:        General: Normal range of motion.  Neurological:     Mental Status: She is alert and oriented to person, place, and time.  Psychiatric:        Mood and Affect: Mood normal.        Behavior: Behavior normal.        Thought Content: Thought content normal.        Judgment: Judgment  normal.     MAU Course  Procedures  MDM CCUA UPT CBC ABO/Rh HCG Wet Prep GC/CT -- Results pending  RPR -- Results pending  OB U/S < 14 wks TVUS  Results for orders placed or performed during the hospital encounter of 10/30/23 (from the past 24 hours)  Pregnancy, urine POC     Status: Abnormal   Collection Time: 10/30/23  3:40 PM  Result Value Ref Range   Preg Test, Ur POSITIVE (A) NEGATIVE  Wet prep, genital     Status: None   Collection Time: 10/30/23  3:52 PM   Specimen: Vaginal  Result Value Ref Range   Yeast Wet Prep HPF POC NONE SEEN NONE SEEN   Trich, Wet Prep NONE SEEN NONE SEEN   Clue Cells Wet Prep HPF POC NONE SEEN NONE SEEN   WBC, Wet Prep HPF POC <10 <10   Sperm NONE SEEN   Urinalysis, Routine w reflex microscopic -Urine, Clean Catch     Status: Abnormal   Collection Time: 10/30/23  4:00 PM  Result Value  Ref Range   Color, Urine STRAW (A) YELLOW   APPearance CLEAR CLEAR   Specific Gravity, Urine 1.008 1.005 - 1.030   pH 8.0 5.0 - 8.0   Glucose, UA NEGATIVE NEGATIVE mg/dL   Hgb urine dipstick SMALL (A) NEGATIVE   Bilirubin Urine NEGATIVE NEGATIVE   Ketones, ur NEGATIVE NEGATIVE mg/dL   Protein, ur NEGATIVE NEGATIVE mg/dL   Nitrite NEGATIVE NEGATIVE   Leukocytes,Ua NEGATIVE NEGATIVE   RBC / HPF 0-5 0 - 5 RBC/hpf   WBC, UA 0-5 0 - 5 WBC/hpf   Bacteria, UA RARE (A) NONE SEEN   Squamous Epithelial / HPF 0-5 0 - 5 /HPF  CBC     Status: Abnormal   Collection Time: 10/30/23  4:02 PM  Result Value Ref Range   WBC 10.6 (H) 4.0 - 10.5 K/uL   RBC 4.15 3.87 - 5.11 MIL/uL   Hemoglobin 12.1 12.0 - 15.0 g/dL   HCT 64.7 (L) 63.9 - 53.9 %   MCV 84.8 80.0 - 100.0 fL   MCH 29.2 26.0 - 34.0 pg   MCHC 34.4 30.0 - 36.0 g/dL   RDW 86.1 88.4 - 84.4 %   Platelets 311 150 - 400 K/uL   nRBC 0.0 0.0 - 0.2 %  hCG, quantitative, pregnancy     Status: Abnormal   Collection Time: 10/30/23  4:02 PM  Result Value Ref Range   hCG, Beta Chain, Quant, S 44,233 (H) <5 mIU/mL    US  OB LESS THAN 14 WEEKS WITH OB TRANSVAGINAL Result Date: 10/30/2023 CLINICAL DATA:  [id number] Vaginal bleeding [id number] Abdominal pain [id number] EXAM: OBSTETRIC <14 WK US  AND TRANSVAGINAL OB US  TECHNIQUE: Both transabdominal and transvaginal ultrasound examinations were performed for complete evaluation of the gestation as well as the maternal uterus, adnexal regions, and pelvic cul-de-sac. Transvaginal technique was performed to assess early pregnancy. COMPARISON:  None Available. FINDINGS: Intrauterine gestational sac: Single Yolk sac:  Visualized. Embryo:  Visualized. Cardiac Activity: Visualized. Heart Rate: 145 bpm CRL:  12.7 mm   7 w   4 d                  US  EDC: 06/13/2024 Subchorionic hemorrhage: Small. There are some additional small cystic spaces within the endometrium of uncertain etiology. Maternal uterus/adnexae: Ovaries and  adnexal regions within normal limits. No free fluid in the pelvis. IMPRESSION: 1. Single live intrauterine gestation measuring 7 weeks 4  days by crown-rump length. 2. Active heart tones at 145 BPM. Electronically Signed   By: Mabel Converse D.O.   On: 10/30/2023 17:17    Assessment and Plan  1. Intrauterine pregnancy (Primary) - Start [facility] - Information provided on [place] [facility] providers  2. Subchorionic hematoma in first trimester, single or unspecified fetus - Information provided on subchorionic hematoma 3. Vaginal bleeding affecting early pregnancy - Information provided on vaginal bleeding in early pregnancy - Return to MAU: If you have heavier bleeding that soaks through more that 2 pads per hour for an hour or more If you bleed so much that you feel like you might pass out or you do pass out If you have significant abdominal pain that is not improved with Tylenol  1000 mg every 8 hours as needed for pain If you develop a fever > 100.5   4. Threatened miscarriage in early pregnancy -Information provided on threatened miscarriage  5. [redacted] weeks gestation of pregnancy   - Discharge patient - Advised to call [facility] to start prenatal care in about 6 weeks -Message sent to admin pool for [facility] to call patient to establish prenatal care - Patient verbalized an understanding of the plan of care and agrees.   Ala Cart, CNM 10/30/2023, 4:21 PM

## 2023-10-30 NOTE — Discharge Instructions (Signed)
 Regresar a MAU:  Si tiene un sangrado ms intenso que empapa ms de 2 toallas sanitarias por hora durante una hora o ms  Si sangra tanto que siente que podra desmayarse o se desmaya  Si tiene dolor abdominal significativo que no mejora con Tylenol  1000 mg cada 8 horas segn sea necesario para chief technology officer.  Si presenta fiebre > 100.5    Prenatal Care Providers           Center for [facility] @ [facility]  [address] [phone]  Center for [facility] @ [facility]   [address]  [phone]  Center For [facility] @ [facility]       [address] [phone]            Center for [facility] @ [place]     [phone]          Center for [facility] Healthcare @ [facility]   [address] [phone]  Center for [facility] @ [facility]  [address] [phone]     Center for [facility] Healthcare @ [address] [place])  [address]   [phone]     [facility]  Phone: [phone]  [facility]  Phone: [phone]  [facility] Phone: [phone]  [facility] Phone: [phone]  [facility] Phone: [phone]  [facility] Phone: [phone]  [facility]  Phone: [phone];

## 2023-10-31 LAB — GC/CHLAMYDIA PROBE AMP (~~LOC~~) NOT AT ARMC
Chlamydia: NEGATIVE
Comment: NEGATIVE
Comment: NORMAL
Neisseria Gonorrhea: NEGATIVE

## 2023-11-01 ENCOUNTER — Telehealth: Payer: Self-pay | Admitting: Obstetrics and Gynecology

## 2023-11-01 NOTE — Telephone Encounter (Signed)
 Called pt. She stated she was going to call the health department and apply for the Adopted Mom program because she didn't have any insurance, Pt stated she would call us back once she applied for the program.

## 2024-01-12 ENCOUNTER — Other Ambulatory Visit: Payer: Self-pay

## 2024-01-12 ENCOUNTER — Ambulatory Visit (INDEPENDENT_AMBULATORY_CARE_PROVIDER_SITE_OTHER): Payer: Self-pay

## 2024-01-12 ENCOUNTER — Other Ambulatory Visit (HOSPITAL_COMMUNITY)
Admission: RE | Admit: 2024-01-12 | Discharge: 2024-01-12 | Disposition: A | Discharge status: Home / Self Care | Payer: Self-pay | Source: Ambulatory Visit | Attending: Physician Assistant | Admitting: Physician Assistant

## 2024-01-12 VITALS — BP 116/73 | HR 79 | Wt 190.6 lb

## 2024-01-12 DIAGNOSIS — Z3A17 17 weeks gestation of pregnancy: Secondary | ICD-10-CM

## 2024-01-12 DIAGNOSIS — Z3492 Encounter for supervision of normal pregnancy, unspecified, second trimester: Secondary | ICD-10-CM

## 2024-01-12 DIAGNOSIS — O0990 Supervision of high risk pregnancy, unspecified, unspecified trimester: Secondary | ICD-10-CM | POA: Insufficient documentation

## 2024-01-12 DIAGNOSIS — Z3490 Encounter for supervision of normal pregnancy, unspecified, unspecified trimester: Secondary | ICD-10-CM

## 2024-01-12 DIAGNOSIS — Z31430 Encounter of female for testing for genetic disease carrier status for procreative management: Secondary | ICD-10-CM

## 2024-01-12 DIAGNOSIS — Z3493 Encounter for supervision of normal pregnancy, unspecified, third trimester: Secondary | ICD-10-CM | POA: Insufficient documentation

## 2024-01-12 NOTE — Progress Notes (Signed)
 New OB Intake  I connected with Rhonda Waters  on 01/12/24 at 11:15 AM EDT in person and verified that I am speaking with the correct person using two identifiers. Nurse is located at [facility] and pt is located at [employer].  I discussed the limitations, risks, security and privacy concerns of performing an evaluation and management service by telephone and the availability of in person appointments. I also discussed with the patient that there may be a patient responsible charge related to this service. The patient expressed understanding and agreed to proceed.  I explained I am completing New OB Intake today. We discussed EDD of 06/18/2024, by Last Menstrual Period. Pt is [id number]. I reviewed her allergies, medications and Medical/Surgical/OB history.    Patient Active Problem List   Diagnosis Date Noted   Supervision of high risk pregnancy, antepartum 01/12/2024   Missed abortion 01/12/2023   Hx of cholelithiasis 05/03/2012   Obesity (BMI 30-39.9) 05/03/2012   Hx of gastroesophageal reflux (GERD) 05/03/2012    Concerns addressed today  Delivery Plans Plans to deliver at [facility]. Discussed the nature of our practice with multiple providers including residents and students. Due to the size of the practice, the delivering provider may not be the same as those providing prenatal care.   Patient is not interested in water birth. Offered upcoming OB visit with CNM to discuss further.  MyChart/Babyscripts MyChart access verified. I explained pt will have some visits in office and some virtually. Babyscripts instructions given and order placed. Patient verifies receipt of registration text/e-mail. Account successfully created and app downloaded. If patient is a candidate for Optimized scheduling, add to sticky note.   Blood Pressure Cuff/Weight Scale Patient is self-pay; explained patient will be given BP cuff at first prenatal appt. Explained after first prenatal appt pt will check  weekly and document in Babyscripts.  Anatomy US Unable to reach MFM. Direct message sent to Lucretia Kern (MFM scheduler) with information needed to get anatomy US scheduled.  Is patient a CenteringPregnancy candidate?  Not a Candidate   Is patient a Mom+Baby Combined Care candidate?  Not a candidate   If accepted, confirm patient does not intend to move from the area for at least 12 months, then notify Mom+Baby staff  Interested in [place]? If yes, send referral and doula dot phrase.    First visit review I reviewed new OB appt with patient. Explained pt will be seen by Clement Sayres, PA at first visit. Discussed Avelina Laine genetic screening with patient. [employer] and [employer].. Routine prenatal labs is not needed at new ob visit.  Last Pap Diagnosis  Date Value Ref Range Status  01/11/2017   Final   NEGATIVE FOR INTRAEPITHELIAL LESIONS OR MALIGNANCY.  01/11/2017 SHIFT IN [place] SUGGESTIVE OF BACTERIAL VAGINOSIS.  Final    Lowry Bowl, CMA 01/12/2024  11:53 AM

## 2024-01-12 NOTE — Patient Instructions (Signed)

## 2024-01-14 LAB — AFP, SERUM, OPEN SPINA BIFIDA
AFP MoM: 1.58
AFP Value: 55.5 ng/mL
Gest. Age on Collection Date: 17.4 wk
Maternal Age At EDD: 38.9 a
OSBR Risk 1 IN: 2212
Test Results:: NEGATIVE
Weight: 184 [lb_av]

## 2024-01-14 LAB — CBC/D/PLT+RPR+RH+ABO+RUBIGG...
Antibody Screen: NEGATIVE
Basophils Absolute: 0.1 10*3/uL (ref 0.0–0.2)
Basos: 1 %
EOS (ABSOLUTE): 0.1 10*3/uL (ref 0.0–0.4)
Eos: 1 %
HCV Ab: NONREACTIVE
HIV Screen 4th Generation wRfx: NONREACTIVE
Hematocrit: 35.5 % (ref 34.0–46.6)
Hemoglobin: 11.8 g/dL (ref 11.1–15.9)
Hepatitis B Surface Ag: NEGATIVE
Immature Grans (Abs): 0.1 10*3/uL (ref 0.0–0.1)
Immature Granulocytes: 1 %
Lymphocytes Absolute: 3.1 10*3/uL (ref 0.7–3.1)
Lymphs: 31 %
MCH: 28.4 pg (ref 26.6–33.0)
MCHC: 33.2 g/dL (ref 31.5–35.7)
MCV: 86 fL (ref 79–97)
Monocytes Absolute: 0.5 10*3/uL (ref 0.1–0.9)
Monocytes: 4 %
Neutrophils Absolute: 6.4 10*3/uL (ref 1.4–7.0)
Neutrophils: 62 %
Platelets: 338 10*3/uL (ref 150–450)
RBC: 4.15 x10E6/uL (ref 3.77–5.28)
RDW: 13.3 % (ref 11.7–15.4)
RPR Ser Ql: NONREACTIVE
Rh Factor: POSITIVE
Rubella Antibodies, IGG: 4.71 {index} (ref >0.99)
WBC: 10.2 10*3/uL (ref 3.4–10.8)

## 2024-01-14 LAB — HCV INTERPRETATION

## 2024-01-14 LAB — CULTURE, OB URINE

## 2024-01-14 LAB — HEMOGLOBIN A1C
Est. average glucose Bld gHb Est-mCnc: 91 mg/dL
Hgb A1c MFr Bld: 4.8 % (ref 4.8–5.6)

## 2024-01-14 LAB — URINE CULTURE, OB REFLEX

## 2024-01-15 ENCOUNTER — Encounter: Payer: Self-pay | Admitting: Physician Assistant

## 2024-01-16 LAB — GC/CHLAMYDIA PROBE AMP (~~LOC~~) NOT AT ARMC
Chlamydia: NEGATIVE
Comment: NEGATIVE
Comment: NORMAL
Neisseria Gonorrhea: NEGATIVE

## 2024-01-16 NOTE — Progress Notes (Unsigned)
   PRENATAL VISIT NOTE  Subjective:  Rhonda Waters is a 38 y.o. [id number] at [redacted]w[redacted]d being seen today for ongoing prenatal care.  She is currently monitored for the following issues for this {Blank single:19197::"high-risk","low-risk"} pregnancy and has Supervision of high risk pregnancy, antepartum on their problem list.  Patient reports {sx:14538}.   .  .   . Denies leaking of fluid.   The following portions of the patient's history were reviewed and updated as appropriate: allergies, current medications, past family history, past medical history, past social history, past surgical history and problem list.   Objective:  There were no vitals filed for this visit.  Fetal Status:           General:  Alert, oriented and cooperative. Patient is in no acute distress.  Skin: Skin is warm and dry. No rash noted.   Cardiovascular: Normal heart rate noted  Respiratory: Normal respiratory effort, no problems with respiration noted  Abdomen: Soft, gravid, appropriate for gestational age.        Pelvic: Rectal exam deferred        Extremities: Normal range of motion.     Mental Status: Normal mood and affect. Normal behavior. Normal judgment and thought content.   Assessment and Plan:  Pregnancy: [id number] at [redacted]w[redacted]d  1. Supervision of high risk pregnancy, antepartum (Primary) Patient is doing well BP, FHR, FH appropriate Initial labs drawn at intake Continue prenatal vitamins. Genetic Screening discussed: NIPS, carrier screening and AFP *** Ultrasound discussed; fetal anatomic survey: Scheduled 02/09/2024 Problem list reviewed and updated. Reviewed Brx optimized schedule, patient agreeable The nature of [place] [facility] with multiple MDs and other Advanced Practice Providers was explained to patient; also emphasized that residents, students are part of our team. Routine obstetric precautions reviewed.   2. [redacted] weeks gestation of pregnancy Anticipatory  guidance about next visits/weeks of pregnancy given  Preterm labor symptoms and general obstetric precautions including but not limited to vaginal bleeding, contractions, leaking of fluid and fetal movement were reviewed in detail with the patient.  Please refer to After Visit Summary for other counseling recommendations.   No follow-ups on file.  Future Appointments  Date Time Provider Department Center  01/19/2024  9:55 AM Christean Leaf [facility]  02/09/2024  2:00 PM [facility] PROVIDER 1 [facility]  02/09/2024  2:30 PM [facility] US4 [facility]    Ralene Muskrat, PA-C

## 2024-01-18 ENCOUNTER — Encounter: Payer: Self-pay | Admitting: Physician Assistant

## 2024-01-18 LAB — PANORAMA PRENATAL TEST FULL PANEL:PANORAMA TEST PLUS 5 ADDITIONAL MICRODELETIONS: FETAL FRACTION: 8.5

## 2024-01-19 ENCOUNTER — Other Ambulatory Visit: Payer: Self-pay

## 2024-01-19 ENCOUNTER — Ambulatory Visit (INDEPENDENT_AMBULATORY_CARE_PROVIDER_SITE_OTHER): Payer: Self-pay | Admitting: Physician Assistant

## 2024-01-19 ENCOUNTER — Encounter: Payer: Self-pay | Admitting: Physician Assistant

## 2024-01-19 VITALS — BP 116/78 | HR 82 | Wt 193.8 lb

## 2024-01-19 DIAGNOSIS — O0990 Supervision of high risk pregnancy, unspecified, unspecified trimester: Secondary | ICD-10-CM

## 2024-01-19 DIAGNOSIS — O0992 Supervision of high risk pregnancy, unspecified, second trimester: Secondary | ICD-10-CM

## 2024-01-19 DIAGNOSIS — Z3A18 18 weeks gestation of pregnancy: Secondary | ICD-10-CM

## 2024-01-19 LAB — HORIZON CUSTOM: REPORT SUMMARY: NEGATIVE

## 2024-01-19 NOTE — Patient Instructions (Addendum)
 Rhonda Waters

## 2024-01-20 ENCOUNTER — Encounter: Payer: Self-pay | Admitting: Physician Assistant

## 2024-02-09 ENCOUNTER — Ambulatory Visit: Payer: Self-pay

## 2024-02-09 ENCOUNTER — Ambulatory Visit: Payer: Self-pay | Attending: Physician Assistant

## 2024-02-17 ENCOUNTER — Encounter: Payer: Self-pay | Admitting: Family Medicine

## 2024-02-17 ENCOUNTER — Other Ambulatory Visit: Payer: Self-pay

## 2024-02-17 ENCOUNTER — Ambulatory Visit: Payer: Self-pay | Admitting: Family Medicine

## 2024-02-17 VITALS — BP 124/64 | HR 98 | Wt 198.0 lb

## 2024-02-17 DIAGNOSIS — O0990 Supervision of high risk pregnancy, unspecified, unspecified trimester: Secondary | ICD-10-CM

## 2024-02-17 DIAGNOSIS — Z3A22 22 weeks gestation of pregnancy: Secondary | ICD-10-CM

## 2024-02-17 DIAGNOSIS — O0992 Supervision of high risk pregnancy, unspecified, second trimester: Secondary | ICD-10-CM

## 2024-02-17 NOTE — Progress Notes (Signed)
   Subjective:  Rhonda Waters is a 38 y.o. [id number] at [redacted]w[redacted]d being seen today for ongoing prenatal care.  She is currently monitored for the following issues for this low-risk pregnancy and has Supervision of high risk pregnancy, antepartum on their problem list.  Patient reports no complaints.  Contractions: Not present. Vag. Bleeding: None.  Movement: Present. Denies leaking of fluid.   The following portions of the patient's history were reviewed and updated as appropriate: allergies, current medications, past family history, past medical history, past social history, past surgical history and problem list. Problem list updated.  Objective:   Vitals:   02/17/24 1112  BP: 124/64  Pulse: 98  Weight: 198 lb (89.8 kg)    Fetal Status: Fetal Heart Rate (bpm): 150   Movement: Present     General:  Alert, oriented and cooperative. Patient is in no acute distress.  Skin: Skin is warm and dry. No rash noted.   Cardiovascular: Normal heart rate noted  Respiratory: Normal respiratory effort, no problems with respiration noted  Abdomen: Soft, gravid, appropriate for gestational age. Pain/Pressure: Absent     Pelvic: Vag. Bleeding: None     Cervical exam deferred        Extremities: Normal range of motion.  Edema: None  Mental Status: Normal mood and affect. Normal behavior. Normal judgment and thought content.   Urinalysis:      Assessment and Plan:  Pregnancy: [id number] at [redacted]w[redacted]d  1. [redacted] weeks gestation of pregnancy (Primary)   2. Supervision of high risk pregnancy, antepartum BP and FHR normal Scheduled for anatomy scan Labs otherwise up to date  Preterm labor symptoms and general obstetric precautions including but not limited to vaginal bleeding, contractions, leaking of fluid and fetal movement were reviewed in detail with the patient. Please refer to After Visit Summary for other counseling recommendations.  Return in 4 weeks (on 03/16/2024) for ob visit,  [facility].   Teena Feast, MD

## 2024-02-17 NOTE — Patient Instructions (Addendum)
 Opciones de mtodos anticonceptivos Birth Control Options Los mtodos anticonceptivos tambin se denominan anticonceptivos. Los anticonceptivos previenen Firefighter. Hay muchos tipos de anticonceptivos. Trabaje con el mdico para encontrar la opcin ms adecuada para usted. Anticonceptivos que [place] hormonas Estos tipos de anticonceptivos contienen hormonas para Neurosurgeon. Implante anticonceptivo Este es un pequeo tubo que se coloca dentro de la piel del brazo. El tubo Insurance claims handler colocado durante 3 aos como mximo. Inyeccin anticonceptiva Son inyecciones que se aplican cada 3 meses. Pldoras anticonceptivas Esta es una pldora que se toma todos [place]. Debe tomarla a la [employer]. Parche anticonceptivo Este es un parche que se coloca sobre la piel. Se debe cambiar 1 vez por semana durante 3 semanas. Despus de [employer], el parche se debe retirar durante 1 semana. Anillo vaginal  Este es un anillo de plstico blando que se coloca dentro de la vagina. El anillo se deja colocado durante 3 semanas. Luego, se debe retirar durante 1 semana. Despus se coloca un nuevo anillo. Mtodos de barrera  Preservativo masculino Es una cubierta delgada que se coloca sobre el pene antes de [place]. El preservativo se desecha despus de Doctor, hospital. Preservativo femenino Es una cubierta blanda y suelta que se coloca en la vagina antes de [place]. El preservativo se desecha despus de Doctor, hospital. Diafragma El diafragma es una barrera blanda con forma de tazn. Debe estar hecho para adaptarse a su cuerpo. Se coloca en la vagina antes de tener sexo con una sustancia qumica que destruye los espermatozoides llamada espermicida. El Designer, fashion/clothing en la vagina durante 6 a 8 horas despus de tener sexo y debe retirarse en un plazo de 24 horas. El diafragma se debe reemplazar: Cada 1 o 2 aos. Despus de dar a luz. Despus de aumentar ms de 15 libras (6.8  kg). Si se somete a una ciruga en la pelvis. Capuchn cervical Este es un capuchn pequeo y [place] se fija sobre el cuello uterino. El cuello uterino es la parte ms baja del tero. Se coloca en la vagina antes del sexo, junto con un espermicida. El capuchn debe fabricarse para usted. El capuchn se debe dejar colocado durante 6 a 8 horas despus del sexo. Se debe retirar en un plazo de 48 horas. El capuchn cervical debe ser recetado y adaptado a su cuerpo por un mdico. Debe reemplazarse cada 2 aos. Esponja Esta es una esponja pequea que se coloca en la vagina antes de [place]. Se debe dejar colocada durante al menos 6 horas despus de [employer]. Se debe retirar en un plazo de 30 horas y desecharse. Espermicidas Son sustancias qumicas que destruyen o impiden que los espermatozoides ingresen al tero. Se pueden presentar en forma de pldora, crema, gel o espuma que se debe colocar en la vagina. Se deben usar al menos de 10 a 15 minutos antes de [employer]. Dispositivo intrauterino Un dispositivo intrauterino (DIU) es un dispositivo que un mdico coloca dentro del tero. Existen dos tipos: DIU hormonal. Este tipo puede permanecer colocado durante 3 a 5 aos. DIU de cobre. Este tipo Insurance claims handler colocado durante 10 aos. Mtodos anticonceptivos permanentes Ligadura de trompas en la mujer Es una ciruga para obstruir las trompas de [place]. Esterilizacin masculina Es una ciruga, llamada vasectoma, para ligar los conductos que transportan los espermatozoides en los hombres. Este mtodo funciona al cabo de 3 meses. Se deben usar otros mtodos anticonceptivos durante 3 meses. Mtodos de planificacin  natural Esto significa no tener [employer] la pareja femenina podra quedar embarazada. A continuacin se mencionan algunos mtodos anticonceptivos por planificacin natural: Usar un calendario a fin de: Hacer un seguimiento de la duracin de cada ciclo  menstrual. Determinar en [employer] se podra producir Firefighter. Planificar no tener [employer] en que se podra producir Firefighter. Reconocer los signos de la ovulacin y no tener relaciones sexuales durante ese perodo. La pareja femenina puede detectar cundo ser la ovulacin haciendo un seguimiento de su temperatura todos [place]. Tambin puede examinar si hay cambios en la mucosidad que proviene del cuello uterino. Dnde obtener ms informacin Centers for Disease Control and Prevention (Centros para el Control y la Prevencin de Enfermedades): [email or web address]. Luego: Introduzca "birth control" o "anticonceptivos" en el cuadro de bsqueda. Esta informacin no tiene Theme park manager el consejo del mdico. Asegrese de hacerle al mdico cualquier pregunta que tenga. Document Revised: 06/10/2023 Document Reviewed: 06/10/2023 Elsevier Patient Education  2024 [employer].

## 2024-03-14 ENCOUNTER — Other Ambulatory Visit: Payer: Self-pay

## 2024-03-14 DIAGNOSIS — Z3A26 26 weeks gestation of pregnancy: Secondary | ICD-10-CM

## 2024-03-15 DIAGNOSIS — O09529 Supervision of elderly multigravida, unspecified trimester: Secondary | ICD-10-CM | POA: Insufficient documentation

## 2024-03-15 NOTE — Progress Notes (Unsigned)
   PRENATAL VISIT NOTE  Subjective:  Rhonda Waters is a 38 y.o. [phone] at [redacted]w[redacted]d being seen today for ongoing prenatal care.  She is currently monitored for the following issues for this low-risk pregnancy and has Supervision of high risk pregnancy, antepartum on their problem list.  Patient reports {sx:14538}.   .  .   . Denies leaking of fluid.   The following portions of the patient's history were reviewed and updated as appropriate: allergies, current medications, past family history, past medical history, past social history, past surgical history and problem list.   Objective:    There were no vitals filed for this visit.  Fetal Status:           General: Alert, oriented and cooperative. Patient is in no acute distress.  Skin: Skin is warm and dry. No rash noted.   Cardiovascular: Normal heart rate noted  Respiratory: Normal respiratory effort, no problems with respiration noted  Abdomen: Soft, gravid, appropriate for gestational age.        Pelvic: Cervical exam deferred        Extremities: Normal range of motion.     Mental Status: Normal mood and affect. Normal behavior. Normal judgment and thought content.   Assessment and Plan:  Pregnancy: [id number] at [redacted]w[redacted]d 1. Supervision of high risk pregnancy, antepartum (Primary) 28 wk labs today TDAP letter given *** Rh pos Anatomy scheduled for 6/5.   2. Pregnancy with 26 completed weeks gestation   Preterm labor symptoms and general obstetric precautions including but not limited to vaginal bleeding, contractions, leaking of fluid and fetal movement were reviewed in detail with the patient. Please refer to After Visit Summary for other counseling recommendations.   No follow-ups on file.  Future Appointments  Date Time Provider Department Center  03/16/2024  8:20 AM [facility]  03/16/2024  9:55 AM Lacey Pian, MD [facility]  03/22/2024  7:00 AM [facility] PROVIDER 1 [facility]  03/22/2024  7:30 AM  [facility] US4 [facility]    Lacey Pian, MD

## 2024-03-16 ENCOUNTER — Encounter: Payer: Self-pay | Admitting: Obstetrics and Gynecology

## 2024-03-16 ENCOUNTER — Other Ambulatory Visit: Payer: Self-pay

## 2024-03-16 ENCOUNTER — Ambulatory Visit (INDEPENDENT_AMBULATORY_CARE_PROVIDER_SITE_OTHER): Payer: Self-pay | Admitting: Obstetrics and Gynecology

## 2024-03-16 VITALS — BP 104/69 | HR 77 | Wt 196.5 lb

## 2024-03-16 DIAGNOSIS — Z3A26 26 weeks gestation of pregnancy: Secondary | ICD-10-CM

## 2024-03-16 DIAGNOSIS — O09522 Supervision of elderly multigravida, second trimester: Secondary | ICD-10-CM

## 2024-03-16 DIAGNOSIS — O09529 Supervision of elderly multigravida, unspecified trimester: Secondary | ICD-10-CM

## 2024-03-16 DIAGNOSIS — O0990 Supervision of high risk pregnancy, unspecified, unspecified trimester: Secondary | ICD-10-CM

## 2024-03-17 LAB — CBC
Hematocrit: 34.9 % (ref 34.0–46.6)
Hemoglobin: 11.6 g/dL (ref 11.1–15.9)
MCH: 28.4 pg (ref 26.6–33.0)
MCHC: 33.2 g/dL (ref 31.5–35.7)
MCV: 85 fL (ref 79–97)
Platelets: 303 10*3/uL (ref 150–450)
RBC: 4.09 x10E6/uL (ref 3.77–5.28)
RDW: 13.7 % (ref 11.7–15.4)
WBC: 6.8 10*3/uL (ref 3.4–10.8)

## 2024-03-17 LAB — GLUCOSE TOLERANCE, 2 HOURS W/ 1HR
Glucose, 1 hour: 86 mg/dL (ref 70–179)
Glucose, 2 hour: 79 mg/dL (ref 70–152)
Glucose, Fasting: 78 mg/dL (ref 70–91)

## 2024-03-17 LAB — HIV ANTIBODY (ROUTINE TESTING W REFLEX): HIV Screen 4th Generation wRfx: NONREACTIVE

## 2024-03-17 LAB — RPR: RPR Ser Ql: NONREACTIVE

## 2024-03-19 ENCOUNTER — Ambulatory Visit: Payer: Self-pay | Admitting: Obstetrics and Gynecology

## 2024-03-22 ENCOUNTER — Ambulatory Visit: Payer: Self-pay | Admitting: Maternal & Fetal Medicine

## 2024-03-22 ENCOUNTER — Other Ambulatory Visit: Payer: Self-pay | Admitting: *Deleted

## 2024-03-22 ENCOUNTER — Ambulatory Visit: Payer: Self-pay | Attending: Physician Assistant

## 2024-03-22 VITALS — BP 109/56 | HR 79

## 2024-03-22 DIAGNOSIS — Z363 Encounter for antenatal screening for malformations: Secondary | ICD-10-CM | POA: Insufficient documentation

## 2024-03-22 DIAGNOSIS — Z362 Encounter for other antenatal screening follow-up: Secondary | ICD-10-CM

## 2024-03-22 DIAGNOSIS — O3662X0 Maternal care for excessive fetal growth, second trimester, not applicable or unspecified: Secondary | ICD-10-CM

## 2024-03-22 DIAGNOSIS — E669 Obesity, unspecified: Secondary | ICD-10-CM

## 2024-03-22 DIAGNOSIS — O0990 Supervision of high risk pregnancy, unspecified, unspecified trimester: Secondary | ICD-10-CM | POA: Insufficient documentation

## 2024-03-22 DIAGNOSIS — O99212 Obesity complicating pregnancy, second trimester: Secondary | ICD-10-CM

## 2024-03-22 DIAGNOSIS — Z3A27 27 weeks gestation of pregnancy: Secondary | ICD-10-CM

## 2024-03-22 DIAGNOSIS — O09522 Supervision of elderly multigravida, second trimester: Secondary | ICD-10-CM

## 2024-03-22 DIAGNOSIS — Z3A17 17 weeks gestation of pregnancy: Secondary | ICD-10-CM

## 2024-03-22 NOTE — Progress Notes (Signed)
 After review, MFM consult with provider is not indicated for today  Kristi Petties, MD 03/22/2024 8:56 AM  [facility]

## 2024-04-16 ENCOUNTER — Other Ambulatory Visit: Payer: Self-pay

## 2024-04-16 ENCOUNTER — Ambulatory Visit: Payer: Self-pay | Admitting: Obstetrics & Gynecology

## 2024-04-16 VITALS — BP 105/70 | HR 82 | Wt 204.3 lb

## 2024-04-16 DIAGNOSIS — O09523 Supervision of elderly multigravida, third trimester: Secondary | ICD-10-CM

## 2024-04-16 DIAGNOSIS — O09522 Supervision of elderly multigravida, second trimester: Secondary | ICD-10-CM

## 2024-04-16 DIAGNOSIS — O0990 Supervision of high risk pregnancy, unspecified, unspecified trimester: Secondary | ICD-10-CM

## 2024-04-16 DIAGNOSIS — O0993 Supervision of high risk pregnancy, unspecified, third trimester: Secondary | ICD-10-CM

## 2024-04-16 DIAGNOSIS — Z3A31 31 weeks gestation of pregnancy: Secondary | ICD-10-CM

## 2024-04-16 MED ORDER — PANTOPRAZOLE SODIUM 20 MG PO TBEC: 20.0000 mg | DELAYED_RELEASE_TABLET | Freq: Every day | ORAL | 3 refills | Status: AC

## 2024-04-16 NOTE — Progress Notes (Signed)
   PRENATAL VISIT NOTE  Subjective:  Rhonda Waters is a 38 y.o. [id number] at [redacted]w[redacted]d being seen today for ongoing prenatal care.  She is currently monitored for the following issues for this high-risk pregnancy and has Supervision of high risk pregnancy, antepartum and AMA (advanced maternal age) multigravida 35+ on their problem list.  Patient reports heartburn.  Contractions: Not present. Vag. Bleeding: None.  Movement: Present. Denies leaking of fluid.   The following portions of the patient's history were reviewed and updated as appropriate: allergies, current medications, past family history, past medical history, past social history, past surgical history and problem list.   Objective:    Vitals:   04/16/24 1322  BP: 105/70  Pulse: 82  Weight: 204 lb 4.8 oz (92.7 kg)    Fetal Status:  Fetal Heart Rate (bpm): 138   Movement: Present    General: Alert, oriented and cooperative. Patient is in no acute distress.  Skin: Skin is warm and dry. No rash noted.   Cardiovascular: Normal heart rate noted  Respiratory: Normal respiratory effort, no problems with respiration noted  Abdomen: Soft, gravid, appropriate for gestational age.  Pain/Pressure: Present     Pelvic: Cervical exam deferred        Extremities: Normal range of motion.  Edema: Trace  Mental Status: Normal mood and affect. Normal behavior. Normal judgment and thought content.   Assessment and Plan:  Pregnancy: [id number] at [redacted]w[redacted]d 1. [redacted] weeks gestation of pregnancy (Primary) Tx heartburn  2. Multigravida of advanced maternal age in second trimester   3. Supervision of high risk pregnancy, antepartum  - pantoprazole (PROTONIX) 20 MG tablet; Take 1 tablet (20 mg total) by mouth daily.  Dispense: 30 tablet; Refill: 3  Preterm labor symptoms and general obstetric precautions including but not limited to vaginal bleeding, contractions, leaking of fluid and fetal movement were reviewed in detail with the  patient. Please refer to After Visit Summary for other counseling recommendations.   Return in about 2 weeks (around 04/30/2024).  Future Appointments  Date Time Provider Department Center  04/26/2024  1:00 PM [facility] PROVIDER 1 [facility]  04/26/2024  1:30 PM [facility] US3 [facility]    Lynwood Solomons, MD

## 2024-04-26 ENCOUNTER — Ambulatory Visit: Payer: Self-pay

## 2024-04-26 ENCOUNTER — Ambulatory Visit: Payer: Self-pay | Attending: Maternal & Fetal Medicine | Admitting: Obstetrics and Gynecology

## 2024-04-26 VITALS — BP 99/50

## 2024-04-26 DIAGNOSIS — Z362 Encounter for other antenatal screening follow-up: Secondary | ICD-10-CM

## 2024-04-26 DIAGNOSIS — Z3A32 32 weeks gestation of pregnancy: Secondary | ICD-10-CM

## 2024-04-26 DIAGNOSIS — O0993 Supervision of high risk pregnancy, unspecified, third trimester: Secondary | ICD-10-CM

## 2024-04-26 DIAGNOSIS — E669 Obesity, unspecified: Secondary | ICD-10-CM

## 2024-04-26 DIAGNOSIS — O3662X0 Maternal care for excessive fetal growth, second trimester, not applicable or unspecified: Secondary | ICD-10-CM

## 2024-04-26 DIAGNOSIS — O99213 Obesity complicating pregnancy, third trimester: Secondary | ICD-10-CM

## 2024-04-26 DIAGNOSIS — O0990 Supervision of high risk pregnancy, unspecified, unspecified trimester: Secondary | ICD-10-CM

## 2024-04-26 DIAGNOSIS — O09523 Supervision of elderly multigravida, third trimester: Secondary | ICD-10-CM

## 2024-04-26 DIAGNOSIS — O3663X0 Maternal care for excessive fetal growth, third trimester, not applicable or unspecified: Secondary | ICD-10-CM | POA: Insufficient documentation

## 2024-04-26 NOTE — Progress Notes (Signed)
 After review, MFM consult with provider is not indicated for today  Arna Ranks, MD 04/26/2024 4:49 PM  [facility]

## 2024-05-10 ENCOUNTER — Ambulatory Visit: Payer: Self-pay | Admitting: Obstetrics and Gynecology

## 2024-05-10 VITALS — BP 108/73 | HR 92 | Wt 203.0 lb

## 2024-05-10 DIAGNOSIS — O09523 Supervision of elderly multigravida, third trimester: Secondary | ICD-10-CM

## 2024-05-10 DIAGNOSIS — Z3A34 34 weeks gestation of pregnancy: Secondary | ICD-10-CM

## 2024-05-10 DIAGNOSIS — O0993 Supervision of high risk pregnancy, unspecified, third trimester: Secondary | ICD-10-CM

## 2024-05-10 DIAGNOSIS — O0990 Supervision of high risk pregnancy, unspecified, unspecified trimester: Secondary | ICD-10-CM

## 2024-05-10 NOTE — Progress Notes (Signed)
    PRENATAL VISIT NOTE  Subjective:  Rhonda Waters is a 38 y.o. [phone] at [redacted]w[redacted]d being seen today for ongoing prenatal care.  She is currently monitored for the following issues for this high-risk pregnancy and has Supervision of high risk pregnancy, antepartum and AMA (advanced maternal age) multigravida 35+ on their problem list.  Patient doing well with no acute concerns today. She reports light spotting on Monday and lower pelvic pain.  Contractions: Not present. Vag. Bleeding: Scant.  Movement: Present. Denies leaking of fluid.   The following portions of the patient's history were reviewed and updated as appropriate: allergies, current medications, past family history, past medical history, past social history, past surgical history and problem list. Problem list updated.  Objective:   Vitals:   05/10/24 1336  BP: 108/73  Pulse: 92  Weight: 203 lb (92.1 kg)    Fetal Status: Fetal Heart Rate (bpm): 145   Movement: Present     General:  Alert, oriented and cooperative. Patient is in no acute distress.  Skin: Skin is warm and dry. No rash noted.   Cardiovascular: Normal heart rate noted  Respiratory: Normal respiratory effort, no problems with respiration noted  Abdomen: Soft, gravid, appropriate for gestational age.  Pain/Pressure: Present (lower abdominal and back pain)     Pelvic: Cervical exam performed        Extremities: Normal range of motion.  Edema: Trace  Mental Status:  Normal mood and affect. Normal behavior. Normal judgment and thought content.  SSE: normal cervix, no old blood or active bleeding.  Cervix did not appear friable.  Visually closed/thick  Assessment and Plan:  Pregnancy: [id number] at [redacted]w[redacted]d  1. [redacted] weeks gestation of pregnancy (Primary)   2. Supervision of high risk pregnancy, antepartum Continue routine prenatal care Labor precautions given  3. Multigravida of advanced maternal age in third trimester   Preterm labor symptoms and  general obstetric precautions including but not limited to vaginal bleeding, contractions, leaking of fluid and fetal movement were reviewed in detail with the patient.  Please refer to After Visit Summary for other counseling recommendations.   Return in about 2 weeks (around 05/24/2024) for ROB, in person, 36 weeks swabs.   Jerilynn Buddle, MD Faculty Attending Center for [facility]

## 2024-05-19 ENCOUNTER — Inpatient Hospital Stay (HOSPITAL_COMMUNITY)
Admission: AD | Admit: 2024-05-19 | Discharge: 2024-05-19 | Disposition: A | Discharge status: Home / Self Care | Payer: Self-pay | Attending: Obstetrics & Gynecology | Admitting: Obstetrics & Gynecology

## 2024-05-19 ENCOUNTER — Encounter (HOSPITAL_COMMUNITY): Payer: Self-pay | Admitting: Obstetrics & Gynecology

## 2024-05-19 DIAGNOSIS — R109 Unspecified abdominal pain: Secondary | ICD-10-CM

## 2024-05-19 DIAGNOSIS — Z3689 Encounter for other specified antenatal screening: Secondary | ICD-10-CM

## 2024-05-19 DIAGNOSIS — O26893 Other specified pregnancy related conditions, third trimester: Secondary | ICD-10-CM

## 2024-05-19 DIAGNOSIS — M549 Dorsalgia, unspecified: Secondary | ICD-10-CM

## 2024-05-19 DIAGNOSIS — Z3A35 35 weeks gestation of pregnancy: Secondary | ICD-10-CM

## 2024-05-19 LAB — URINALYSIS, ROUTINE W REFLEX MICROSCOPIC
Bilirubin Urine: NEGATIVE
Glucose, UA: NEGATIVE mg/dL
Hgb urine dipstick: NEGATIVE
Ketones, ur: NEGATIVE mg/dL
Leukocytes,Ua: NEGATIVE
Nitrite: NEGATIVE
Protein, ur: NEGATIVE mg/dL
Specific Gravity, Urine: 1.008 (ref 1.005–1.030)
pH: 6 (ref 5.0–8.0)

## 2024-05-19 LAB — WET PREP, GENITAL
Clue Cells Wet Prep HPF POC: NONE SEEN
Sperm: NONE SEEN
Trich, Wet Prep: NONE SEEN
WBC, Wet Prep HPF POC: >=10 — AB (ref <10)
Yeast Wet Prep HPF POC: NONE SEEN

## 2024-05-19 MED ORDER — ACETAMINOPHEN 500 MG PO TABS
1000.0000 mg | ORAL_TABLET | Freq: Once | ORAL | Status: AC
Start: 2024-05-19 — End: 2024-05-19
  Administered 2024-05-19: 1000 mg via ORAL
  Filled 2024-05-19: qty 2

## 2024-05-19 MED ORDER — CYCLOBENZAPRINE HCL 5 MG PO TABS
10.0000 mg | ORAL_TABLET | Freq: Once | ORAL | Status: AC
  Administered 2024-05-19: 10 mg via ORAL
  Filled 2024-05-19: qty 2

## 2024-05-19 NOTE — MAU Provider Note (Signed)
 History     CSN: [id number]  Arrival date and time: 05/19/24 1904   Event Date/Time   First Provider Initiated Contact with Patient 05/19/24 2013      Chief Complaint  Patient presents with  . Abdominal Pain   Rhonda Waters Maxie Debose is a 38 y.o. [id number] at [redacted]w[redacted]d who receives care at [facility].  She presents today for abdominal pain.  She states the pain started yesterday and is intermittent.  She denies worsening or relieving factors.  She describes the pain as sharp and strong while lasting 3 minutes.  She rates the pain a 8/10.  She also reports nausea and diarrhea this morning that resolved without interventions.    Patient endorses fetal movement. She denies vaginal bleeding or discharge of concern.   OB History     Gravida  6   Para  4   Term  4   Preterm      AB  1   Living  4      SAB  1   IAB      Ectopic      Multiple  0   Live Births  4           Past Medical History:  Diagnosis Date  . GERD (gastroesophageal reflux disease)   . Hx of cholelithiasis 05/03/2012   Diagnosed in 12/2010 by abdominal U/S. Largest stone 1.6 cm. Pt has not had Surgical evaluation for this.    . Missed abortion 01/12/2023  . Nausea & vomiting 10/20/2011  . Obesity (BMI 30-39.9) 05/03/2012   BMI of 38.92-> 37.62      Past Surgical History:  Procedure Laterality Date  . CHOLECYSTECTOMY  10/20/2012   Procedure: LAPAROSCOPIC CHOLECYSTECTOMY WITH INTRAOPERATIVE CHOLANGIOGRAM;  Surgeon: Krystal CHRISTELLA Spinner, MD;  Location: [facility] OR;  Service: General;  Laterality: N/A;    Family History  Problem Relation Age of Onset  . Cancer Neg Hx   . Hypertension Neg Hx     Social History   Tobacco Use  . Smoking status: Never  . Smokeless tobacco: Never  Vaping Use  . Vaping status: Never Used  Substance Use Topics  . Alcohol use: No  . Drug use: No    Allergies:  Allergies  Allergen Reactions  . Other Rash    Sour cream    Medications Prior to Admission  Medication Sig  Dispense Refill Last Dose/Taking  . pantoprazole  (PROTONIX ) 20 MG tablet Take 1 tablet (20 mg total) by mouth daily. 30 tablet 3 05/19/2024  . Prenatal Vit-Fe Fumarate-FA (PRENATAL PO) Take by mouth.   05/18/2024    Review of Systems  Gastrointestinal:  Positive for abdominal pain, diarrhea (This morning 3x-watery) and nausea. Negative for constipation and vomiting.  Genitourinary:  Negative for difficulty urinating, dysuria, vaginal bleeding and vaginal discharge.  Musculoskeletal:  Positive for back pain.   Physical Exam   Blood pressure 108/60, pulse 77, temperature 98 F (36.7 C), temperature source Oral, resp. rate 16, height 5' 3 (1.6 m), weight 90.7 kg, last menstrual period 09/12/2023, SpO2 99%, unknown if currently breastfeeding.  Physical Exam Vitals and nursing note reviewed. Exam conducted with a chaperone present Delorse, RN).  Constitutional:      Appearance: She is well-developed.  HENT:     Head: Normocephalic and atraumatic.  Eyes:     Conjunctiva/sclera: Conjunctivae normal.  Cardiovascular:     Rate and Rhythm: Normal rate.  Pulmonary:     Effort: Pulmonary effort is normal. No  respiratory distress.  Abdominal:     Tenderness: There is abdominal tenderness in the left lower quadrant.     Comments: Gravid, Appears AGA  Skin:    General: Skin is warm and dry.  Neurological:     Mental Status: She is alert and oriented to person, place, and time.  Psychiatric:        Mood and Affect: Mood normal.        Behavior: Behavior normal.     Fetal Assessment 135 bpm, Mod Var, -Decels, +Accels Toco:   MAU Course   Results for orders placed or performed during the hospital encounter of 05/19/24 (from the past 24 hours)  Urinalysis, Routine w reflex microscopic -Urine, Clean Catch     Status: Abnormal   Collection Time: 05/19/24  8:06 PM  Result Value Ref Range   Color, Urine STRAW (A) YELLOW   APPearance CLEAR CLEAR   Specific Gravity, Urine 1.008 1.005 - 1.030    pH 6.0 5.0 - 8.0   Glucose, UA NEGATIVE NEGATIVE mg/dL   Hgb urine dipstick NEGATIVE NEGATIVE   Bilirubin Urine NEGATIVE NEGATIVE   Ketones, ur NEGATIVE NEGATIVE mg/dL   Protein, ur NEGATIVE NEGATIVE mg/dL   Nitrite NEGATIVE NEGATIVE   Leukocytes,Ua NEGATIVE NEGATIVE  Wet prep, genital     Status: Abnormal   Collection Time: 05/19/24  9:08 PM  Result Value Ref Range   Yeast Wet Prep HPF POC NONE SEEN NONE SEEN   Trich, Wet Prep NONE SEEN NONE SEEN   Clue Cells Wet Prep HPF POC NONE SEEN NONE SEEN   WBC, Wet Prep HPF POC >=10 (A) <10   Sperm NONE SEEN    No results found.   MDM PE Labs: EFM  Assessment and Plan   38 year old [id number]  SIUP at 35.5 weeks Cat I FT    -Exam findings discussed. GLENWOOD Harlene LITTIE Synthia MSN, CNM 05/19/2024, 8:13 PM   Reassessment (9:31 PM)

## 2024-05-19 NOTE — MAU Note (Signed)
 Rhonda Waters is a 38 y.o. at [redacted]w[redacted]d here in MAU reporting:   Since yesterday pt reports having lower back pain and lower abdominal pain that comes and goes. Pt states she has not taken anything for the pain. Pt reports feeling pressure as well. No LOF, NO bleeding +FM.   Rates lower back pain and lower abdominal pressure 8/10.   Vitals:   05/19/24 1949  BP: 108/60  Pulse: 77  Resp: 16  Temp: 98 F (36.7 C)  SpO2: 99%    UA ordered.   FHR: 149

## 2024-05-24 ENCOUNTER — Ambulatory Visit: Payer: Self-pay | Admitting: Obstetrics & Gynecology

## 2024-05-24 ENCOUNTER — Other Ambulatory Visit: Payer: Self-pay

## 2024-05-24 ENCOUNTER — Other Ambulatory Visit (HOSPITAL_COMMUNITY)
Admission: RE | Admit: 2024-05-24 | Discharge: 2024-05-24 | Disposition: A | Discharge status: Home / Self Care | Payer: Self-pay | Source: Ambulatory Visit | Attending: Obstetrics & Gynecology | Admitting: Obstetrics & Gynecology

## 2024-05-24 ENCOUNTER — Encounter: Payer: Self-pay | Admitting: Obstetrics & Gynecology

## 2024-05-24 VITALS — BP 102/70 | HR 80 | Wt 202.0 lb

## 2024-05-24 DIAGNOSIS — O0990 Supervision of high risk pregnancy, unspecified, unspecified trimester: Secondary | ICD-10-CM

## 2024-05-24 DIAGNOSIS — Z3A36 36 weeks gestation of pregnancy: Secondary | ICD-10-CM

## 2024-05-24 DIAGNOSIS — O09523 Supervision of elderly multigravida, third trimester: Secondary | ICD-10-CM | POA: Insufficient documentation

## 2024-05-24 DIAGNOSIS — O368130 Decreased fetal movements, third trimester, not applicable or unspecified: Secondary | ICD-10-CM

## 2024-05-24 NOTE — Progress Notes (Signed)
   Patient is Spanish-speaking only, interpreter present for this encounter.  PRENATAL VISIT NOTE  Subjective:  Rhonda Waters is a 38 y.o. [phone] at [redacted]w[redacted]d being seen today for ongoing prenatal care.  She is currently monitored for the following issues for this high-risk pregnancy and has Supervision of high risk pregnancy, antepartum and AMA (advanced maternal age) multigravida 35+ on their problem list.  Patient reports decreased fetal movement x 2 days.  Contractions: Irritability. Vag. Bleeding: None.  Movement: (!) Decreased. Denies leaking of fluid.   The following portions of the patient's history were reviewed and updated as appropriate: allergies, current medications, past family history, past medical history, past social history, past surgical history and problem list.   Objective:    Vitals:   05/24/24 0958  BP: 102/70  Pulse: 80  Weight: 202 lb (91.6 kg)    Fetal Status:  Fetal Heart Rate (bpm): 158   Movement: (!) Decreased Presentation: Vertex  General: Alert, oriented and cooperative. Patient is in no acute distress.  Skin: Skin is warm and dry. No rash noted.   Cardiovascular: Normal heart rate noted  Respiratory: Normal respiratory effort, no problems with respiration noted  Abdomen: Soft, gravid, appropriate for gestational age.  Pain/Pressure: Present     Pelvic: Cervical exam performed in the presence of a chaperone Dilation: 2.5 Effacement (%): 60 Station: -3, cultures obtained  Extremities: Normal range of motion.  Edema: None  Mental Status: Normal mood and affect. Normal behavior. Normal judgment and thought content.   Assessment and Plan:  Pregnancy: [id number] at [redacted]w[redacted]d 1. Decreased fetal movements in third trimester, single or unspecified fetus (Primary) NST performed today was reviewed and was found to be reactive. Fetal movement precautions strictly emphasized.  2. Multigravida of advanced maternal age in third trimester LR NIPS  3. [redacted] weeks  gestation of pregnancy 4. Supervision of high risk pregnancy, antepartum Pelvic cultures done, will follow up results and manage accordingly. - GC/Chlamydia probe amp ([place])not at [facility] - Culture, beta strep (group b only)  Preterm labor symptoms and general obstetric precautions including but not limited to vaginal bleeding, contractions, leaking of fluid and fetal movement were reviewed in detail with the patient. Please refer to After Visit Summary for other counseling recommendations.   Return in about 1 week (around 05/31/2024) for OFFICE OB VISIT (MD only).  No future appointments.  Gloris Hugger, MD

## 2024-05-25 LAB — GC/CHLAMYDIA PROBE AMP (~~LOC~~) NOT AT ARMC
Chlamydia: NEGATIVE
Comment: NEGATIVE
Comment: NORMAL
Neisseria Gonorrhea: NEGATIVE

## 2024-05-28 ENCOUNTER — Ambulatory Visit: Payer: Self-pay | Admitting: Obstetrics & Gynecology

## 2024-05-28 DIAGNOSIS — O0990 Supervision of high risk pregnancy, unspecified, unspecified trimester: Secondary | ICD-10-CM

## 2024-05-28 LAB — CULTURE, BETA STREP (GROUP B ONLY): Strep Gp B Culture: NEGATIVE

## 2024-05-29 ENCOUNTER — Inpatient Hospital Stay (HOSPITAL_COMMUNITY)
Admission: AD | Admit: 2024-05-29 | Discharge: 2024-05-29 | Disposition: A | Discharge status: Home / Self Care | Payer: Self-pay | Attending: Obstetrics & Gynecology | Admitting: Obstetrics & Gynecology

## 2024-05-29 ENCOUNTER — Other Ambulatory Visit: Payer: Self-pay

## 2024-05-29 ENCOUNTER — Encounter (HOSPITAL_COMMUNITY): Payer: Self-pay | Admitting: Obstetrics & Gynecology

## 2024-05-29 DIAGNOSIS — O479 False labor, unspecified: Secondary | ICD-10-CM

## 2024-05-29 DIAGNOSIS — O471 False labor at or after 37 completed weeks of gestation: Secondary | ICD-10-CM | POA: Insufficient documentation

## 2024-05-29 DIAGNOSIS — Z3A37 37 weeks gestation of pregnancy: Secondary | ICD-10-CM

## 2024-05-29 NOTE — MAU Note (Signed)
 Here for rn labor eval complaining of contractions. Normal vitals, reactive nst, cervix 3 cm and unchanged on repeat check 2 hours later, does not appear to be in labor per RN. Discharged home with labor return precautions.

## 2024-05-29 NOTE — MAU Note (Signed)
 I have communicated with Dr. kandis and reviewed vital signs:  Vitals:   05/29/24 1521 05/29/24 1719  BP: 116/67 114/82  Pulse: 77 81  Resp:    Temp:    SpO2:  97%    Vaginal exam:  Dilation: 3 Effacement (%): 50 Cervical Position: Posterior Station: -3 Presentation: Vertex Exam by:: Griselda Punter, RN,   Also reviewed contraction pattern and that non-stress test is reactive.  It has been documented that patient is contracting every 2.5-8 minutes with no cervical change over 1.5 hours not indicating active labor.  Patient denies any other complaints.  Based on this report provider has given order for discharge.  A discharge order and diagnosis entered by a provider.   Labor discharge instructions reviewed with patient.

## 2024-05-29 NOTE — MAU Note (Signed)
 MAU Triage Note  .Rhonda Waters is a 38 y.o. at [redacted]w[redacted]d here in MAU reporting: reports ctx since yesterday; however they've gotten more intense within the past hour and are about 4-5 minutes apart. Reports light bleeding this morning around 8am, but no bleeding since. Endorses +FM, denies LOF  Onset of complaint: today Pain score: 7/10 ctx  Vitals:   05/29/24 1506  BP: 120/74  Pulse: 81  Resp: 17  Temp: 98.4 F (36.9 C)  SpO2: 98%     FHT: 157  Lab orders placed from triage: mau labor

## 2024-05-31 ENCOUNTER — Inpatient Hospital Stay (HOSPITAL_COMMUNITY)
Admission: AD | Admit: 2024-05-31 | Discharge: 2024-06-02 | DRG: 807 | Disposition: A | Discharge status: Home / Self Care | Payer: MEDICAID | Attending: Obstetrics & Gynecology | Admitting: Obstetrics & Gynecology

## 2024-05-31 ENCOUNTER — Encounter (HOSPITAL_COMMUNITY): Payer: Self-pay | Admitting: Obstetrics & Gynecology

## 2024-05-31 ENCOUNTER — Other Ambulatory Visit: Payer: Self-pay

## 2024-05-31 DIAGNOSIS — Z3A37 37 weeks gestation of pregnancy: Secondary | ICD-10-CM

## 2024-05-31 DIAGNOSIS — O26893 Other specified pregnancy related conditions, third trimester: Secondary | ICD-10-CM | POA: Diagnosis present

## 2024-05-31 DIAGNOSIS — O09523 Supervision of elderly multigravida, third trimester: Secondary | ICD-10-CM

## 2024-05-31 DIAGNOSIS — O4202 Full-term premature rupture of membranes, onset of labor within 24 hours of rupture: Secondary | ICD-10-CM

## 2024-05-31 DIAGNOSIS — O0990 Supervision of high risk pregnancy, unspecified, unspecified trimester: Secondary | ICD-10-CM

## 2024-05-31 DIAGNOSIS — O09529 Supervision of elderly multigravida, unspecified trimester: Secondary | ICD-10-CM

## 2024-05-31 LAB — CBC
HCT: 32.3 % — ABNORMAL LOW (ref 36.0–46.0)
Hemoglobin: 10.5 g/dL — ABNORMAL LOW (ref 12.0–15.0)
MCH: 26.6 pg (ref 26.0–34.0)
MCHC: 32.5 g/dL (ref 30.0–36.0)
MCV: 81.8 fL (ref 80.0–100.0)
Platelets: 294 K/uL (ref 150–400)
RBC: 3.95 MIL/uL (ref 3.87–5.11)
RDW: 14.6 % (ref 11.5–15.5)
WBC: 8.3 K/uL (ref 4.0–10.5)
nRBC: 0 % (ref 0.0–0.2)

## 2024-05-31 LAB — COMPREHENSIVE METABOLIC PANEL WITH GFR
ALT: 12 U/L (ref 0–44)
AST: 15 U/L (ref 15–41)
Albumin: 2.5 g/dL — ABNORMAL LOW (ref 3.5–5.0)
Alkaline Phosphatase: 201 U/L — ABNORMAL HIGH (ref 38–126)
Anion gap: 10 (ref 5–15)
BUN: 6 mg/dL (ref 6–20)
CO2: 18 mmol/L — ABNORMAL LOW (ref 22–32)
Calcium: 8.4 mg/dL — ABNORMAL LOW (ref 8.9–10.3)
Chloride: 107 mmol/L (ref 98–111)
Creatinine, Ser: 0.61 mg/dL (ref 0.44–1.00)
GFR, Estimated: >60 mL/min (ref >60)
Glucose, Bld: 100 mg/dL — ABNORMAL HIGH (ref 70–99)
Potassium: 3.3 mmol/L — ABNORMAL LOW (ref 3.5–5.1)
Sodium: 135 mmol/L (ref 135–145)
Total Bilirubin: 0.5 mg/dL (ref 0.0–1.2)
Total Protein: 6.6 g/dL (ref 6.5–8.1)

## 2024-05-31 LAB — TYPE AND SCREEN
ABO/RH(D): O POS
Antibody Screen: NEGATIVE

## 2024-05-31 LAB — POCT FERN TEST: POCT Fern Test: NEGATIVE

## 2024-05-31 MED ORDER — TRANEXAMIC ACID-NACL 1000-0.7 MG/100ML-% IV SOLN: 1000.0000 mg | INTRAVENOUS | Status: AC

## 2024-05-31 MED ORDER — OXYCODONE-ACETAMINOPHEN 5-325 MG PO TABS: 1.0000 | ORAL_TABLET | ORAL | Status: DC | PRN

## 2024-05-31 MED ORDER — OXYTOCIN-SODIUM CHLORIDE 30-0.9 UT/500ML-% IV SOLN
INTRAVENOUS | Status: AC
  Administered 2024-05-31: 333 mL via INTRAVENOUS
  Filled 2024-05-31: qty 500

## 2024-05-31 MED ORDER — LACTATED RINGERS IV SOLN: INTRAVENOUS | Status: DC

## 2024-05-31 MED ORDER — LIDOCAINE HCL (PF) 1 % IJ SOLN: 30.0000 mL | INTRAMUSCULAR | Status: DC | PRN

## 2024-05-31 MED ORDER — TRANEXAMIC ACID-NACL 1000-0.7 MG/100ML-% IV SOLN
INTRAVENOUS | Status: AC
Start: 2024-05-31 — End: 2024-05-31
  Administered 2024-05-31: 1000 mg via INTRAVENOUS
  Filled 2024-05-31: qty 100

## 2024-05-31 MED ORDER — OXYCODONE-ACETAMINOPHEN 5-325 MG PO TABS: 2.0000 | ORAL_TABLET | ORAL | Status: DC | PRN

## 2024-05-31 MED ORDER — ONDANSETRON HCL 4 MG/2ML IJ SOLN
4.0000 mg | Freq: Four times a day (QID) | INTRAMUSCULAR | Status: DC | PRN
Start: 2024-05-31 — End: 2024-06-01

## 2024-05-31 MED ORDER — FENTANYL CITRATE (PF) 100 MCG/2ML IJ SOLN
50.0000 ug | INTRAMUSCULAR | Status: DC | PRN
  Administered 2024-05-31: 100 ug via INTRAVENOUS
  Filled 2024-05-31: qty 2

## 2024-05-31 MED ORDER — OXYTOCIN-SODIUM CHLORIDE 30-0.9 UT/500ML-% IV SOLN: 2.5000 [IU]/h | INTRAVENOUS | Status: DC

## 2024-05-31 MED ORDER — SOD CITRATE-CITRIC ACID 500-334 MG/5ML PO SOLN: 30.0000 mL | ORAL | Status: DC | PRN

## 2024-05-31 MED ORDER — ACETAMINOPHEN 325 MG PO TABS: 650.0000 mg | ORAL_TABLET | ORAL | Status: DC | PRN

## 2024-05-31 MED ORDER — LACTATED RINGERS IV SOLN: 500.0000 mL | INTRAVENOUS | Status: DC | PRN

## 2024-05-31 MED ORDER — OXYTOCIN BOLUS FROM INFUSION: 333.0000 mL | Freq: Once | INTRAVENOUS | Status: AC

## 2024-05-31 NOTE — H&P (Signed)
 OBSTETRIC ADMISSION HISTORY AND PHYSICAL  Kiamesha Samet Wilfred Nova is a 38 y.o. female [phone] with IUP at [redacted]w[redacted]d (dated by LMP, Estimated Date of Delivery: 06/18/24) presenting for SOL.   She reports +FMs, No LOF, no VB, no blurry vision, headaches or peripheral edema, and RUQ pain.    She plans on breast and bottle feeding. She request depo for birth control.  She received her prenatal [facility] at [facility]   Prenatal History/Complications: AMA,   Past Medical History: Past Medical History:  Diagnosis Date   GERD (gastroesophageal reflux disease)    Hx of cholelithiasis 05/03/2012   Diagnosed in 12/2010 by abdominal U/S. Largest stone 1.6 cm. Pt has not had Surgical evaluation for this.     Missed abortion 01/12/2023   Nausea & vomiting 10/20/2011   Obesity (BMI 30-39.9) 05/03/2012   BMI of 38.92-> 37.62      Past Surgical History: Past Surgical History:  Procedure Laterality Date   CHOLECYSTECTOMY  10/20/2012   Procedure: LAPAROSCOPIC CHOLECYSTECTOMY WITH INTRAOPERATIVE CHOLANGIOGRAM;  Surgeon: Krystal CHRISTELLA Spinner, MD;  Location: [facility] OR;  Service: General;  Laterality: N/A;    Obstetrical History: OB History     Gravida  6   Para  4   Term  4   Preterm      AB  1   Living  4      SAB  1   IAB      Ectopic      Multiple  0   Live Births  4           Social History Social History   Socioeconomic History   Marital status: Single    Spouse name: Not on file   Number of children: Not on file   Years of education: Not on file   Highest education level: Not on file  Occupational History   Not on file  Tobacco Use   Smoking status: Never   Smokeless tobacco: Never  Vaping Use   Vaping status: Never Used  Substance and Sexual Activity   Alcohol use: No   Drug use: No   Sexual activity: Not Currently    Birth control/protection: None  Other Topics Concern   Not on file  Social History Narrative   Not on file   Social Drivers of Health   Financial Resource  Strain: Not on file  Food Insecurity: No Food Insecurity (05/31/2024)   Hunger Vital Sign    Worried About Running Out of Food in the Last Year: Never true    Ran Out of Food in the Last Year: Never true  Transportation Needs: No Transportation Needs (05/31/2024)   PRAPARE - Administrator, Civil Service (Medical): No    Lack of Transportation (Non-Medical): No  Physical Activity: Not on file  Stress: Not on file  Social Connections: Not on file    Family History: Family History  Problem Relation Age of Onset   Cancer Neg Hx    Hypertension Neg Hx     Allergies: Allergies  Allergen Reactions   Other Rash    Sour cream    Medications Prior to Admission  Medication Sig Dispense Refill Last Dose/Taking   pantoprazole  (PROTONIX ) 20 MG tablet Take 1 tablet (20 mg total) by mouth daily. 30 tablet 3    Prenatal Vit-Fe Fumarate-FA (PRENATAL PO) Take by mouth.        Review of Systems  All systems reviewed and negative except as stated in HPI.  Blood pressure 124/67, pulse 94, temperature 98.4 F (36.9 C), temperature source Oral, resp. rate 17, last menstrual period 09/12/2023, unknown if currently breastfeeding. General appearance: alert and appears stated age Lungs: breathing comfortably on room air Heart: regular rate Abdomen: soft, non-tender; gravid Extremities: no edema of bilateral lower extremities DTR's intact Presentation: cephalic Fetal monitoringBaseline: 150 bpm, Variability: Good {> 6 bpm), Accelerations: Reactive, and Decelerations: Absent Uterine activityFrequency: Every 2-3 minutes Dilation: 8.5 Effacement (%): 70 Station: -2 Exam by:: Lacinda MORTON RN   Prenatal labs: ABO, Rh: --/--/PENDING (08/14 2152) Antibody: PENDING (08/14 2152) Rubella: 4.71 (03/27 1306) RPR: Non Reactive (05/30 0851)  HBsAg: Negative (03/27 1306)  HIV: Non Reactive (05/30 0851)  GBS: Negative/-- (08/07 1652)  2 hr Glucola passed Genetic screening  NIPS low  risk Anatomy US  Normal anatomy Last US : At [redacted]w[redacted]d - cephalic presentation, EFW 2323g (86 %tile), AC 92%ile  Prenatal Transfer Tool  Maternal Diabetes: No Genetic Screening: Normal Maternal Ultrasounds/Referrals: Normal Fetal Ultrasounds or other Referrals:  None Maternal Substance Abuse:  No Significant Maternal Medications:  None Significant Maternal Lab Results:  Group B Strep negative Number of Prenatal Visits:greater than 3 verified prenatal visits Other Comments:  None  Results for orders placed or performed during the hospital encounter of 05/31/24 (from the past 24 hours)  Type and screen [facility]   Collection Time: 05/31/24  9:52 PM  Result Value Ref Range   ABO/RH(D) PENDING    Antibody Screen PENDING    Sample Expiration      06/03/2024,2359 Performed at [facility], [address] [place], [place] [address]     Patient Active Problem List   Diagnosis Date Noted   Spontaneous onset of labor after 67 but before 74 completed weeks gestation with delivery by planned cesarean section 05/31/2024   AMA (advanced maternal age) multigravida 35+ 03/15/2024   Supervision of high risk pregnancy, antepartum 01/12/2024    Assessment/Plan:  Jakya Dovidio is a 38 y.o. H3E5985 at [redacted]w[redacted]d here for SOL.   #Labor: ROM with spontaneous onset of labor prior to admission. Progressing well. Expectant management.  #Pain: Per patient request #FWB: Cat I Tracing #ID:  [id number]) #MOF: Both #MOC: Depo #Circ:  N/A  Barkley Angles, MD OB Fellow, Faculty Practice [place], Center for [facility] 05/31/2024 10:11 PM

## 2024-05-31 NOTE — Discharge Summary (Signed)
 Postpartum Discharge Summary     Patient Name: Rhonda Waters DOB: 03/11/1986 MRN: [id number]  Date of admission: 05/31/2024 Delivery date:05/31/2024 Delivering provider: MAGALI BARKLEY CROME Date of discharge: 06/02/2024  Admitting diagnosis: Spontaneous onset of labor Intrauterine pregnancy: [redacted]w[redacted]d     Secondary diagnosis:  Active Problems:   Supervision of high risk pregnancy, antepartum   AMA (advanced maternal age) multigravida 35+  Additional problems: AMA    Discharge diagnosis: Term Pregnancy Delivered                                              Post partum procedures:NA Augmentation: N/A Complications: None  Hospital course: Onset of Labor With Vaginal Delivery      38 y.o. yo [id number] at [redacted]w[redacted]d was admitted in Active Labor on 05/31/2024. Labor course was uncomplicated  Membrane Rupture Time/Date:  ,   Delivery Method:Vaginal, Spontaneous Operative Delivery:N/A Episiotomy: None Lacerations:  None Patient had a postpartum course complicated by nothing.  She is ambulating, tolerating a regular diet, passing flatus, and urinating well. Patient is discharged home in stable condition on 06/02/24.  Newborn Data: Birth date:05/31/2024 Birth time:10:26 PM Gender:Female Living status:Living Apgars:9 ,9  Weight:3360 g  Magnesium Sulfate received: No BMZ received: No Rhophylac:N/A MMR:N/A T-DaP:declined Flu: No RSV Vaccine received: No Transfusion:No  Immunizations received: Immunization History  Administered Date(s) Administered   Influenza,inj,Quad PF,6+ Mos 01/11/2017   Tdap 03/08/2017    Physical exam  Vitals:   06/01/24 0930 06/01/24 1436 06/01/24 2315 06/02/24 0640  BP: 102/61 (!) 105/58 99/60 111/74  Pulse: 72 68 70 63  Resp: 20 20 18 18   Temp: 98.7 F (37.1 C) 97.6 F (36.4 C) 97.9 F (36.6 C) 97.9 F (36.6 C)  TempSrc: Oral Oral Oral Oral  SpO2: 99% 98% 98% 98%   General: alert, cooperative, and no distress Lochia: appropriate Uterine  Fundus: firm Incision: Healing well with no significant drainage, No significant erythema DVT Evaluation: No evidence of DVT seen on physical exam. Labs: Lab Results  Component Value Date   WBC 8.3 05/31/2024   HGB 10.5 (L) 05/31/2024   HCT 32.3 (L) 05/31/2024   MCV 81.8 05/31/2024   PLT 294 05/31/2024      Latest Ref Rng & Units 05/31/2024    9:52 PM  CMP  Glucose 70 - 99 mg/dL 899   BUN 6 - 20 mg/dL 6   Creatinine 9.55 - 8.99 mg/dL 9.38   Sodium 864 - 854 mmol/L 135   Potassium 3.5 - 5.1 mmol/L 3.3   Chloride 98 - 111 mmol/L 107   CO2 22 - 32 mmol/L 18   Calcium 8.9 - 10.3 mg/dL 8.4   Total Protein 6.5 - 8.1 g/dL 6.6   Total Bilirubin 0.0 - 1.2 mg/dL 0.5   Alkaline Phos 38 - 126 U/L 201   AST 15 - 41 U/L 15   ALT 0 - 44 U/L 12    Edinburgh Score:    06/01/2024   11:30 AM  Edinburgh Postnatal Depression Scale Screening Tool  I have been able to laugh and see the funny side of things. 0  I have looked forward with enjoyment to things. 0  I have blamed myself unnecessarily when things went wrong. 0  I have been anxious or worried for no good reason. 0  I have felt scared or panicky  for no good reason. 0  Things have been getting on top of me. 0  I have been so unhappy that I have had difficulty sleeping. 0  I have felt sad or miserable. 0  I have been so unhappy that I have been crying. 0  The thought of harming myself has occurred to me. 0  Edinburgh Postnatal Depression Scale Total 0   Edinburgh Postnatal Depression Scale Total: 0   After visit meds:  Allergies as of 06/02/2024       Reactions   Other Rash   Sour cream        Medication List     TAKE these medications    ibuprofen  800 MG tablet Commonly known as: ADVIL  Take 1 tablet (800 mg total) by mouth every 8 (eight) hours.   pantoprazole  20 MG tablet Commonly known as: Protonix  Take 1 tablet (20 mg total) by mouth daily.   PRENATAL PO Take by mouth.         Discharge home in stable  condition Infant Feeding: Bottle and Breast Infant Disposition:home with mother Discharge instruction: per After Visit Summary and Postpartum booklet. Activity: Advance as tolerated. Pelvic rest for 6 weeks.  Diet: routine diet Future Appointments: Future Appointments  Date Time Provider Department Center  07/12/2024  9:55 AM Zina Jerilynn LABOR, MD [facility] [place]   Follow up Visit:  F/U message sent 06/01/2024     Camie Rote, MSN, CNM, RNC-OB Certified Nurse Midwife, [facility] 06/02/2024 8:32 AM

## 2024-05-31 NOTE — MAU Note (Signed)
 Contractions and possible water broke

## 2024-06-01 ENCOUNTER — Encounter (HOSPITAL_COMMUNITY): Payer: Self-pay | Admitting: Obstetrics & Gynecology

## 2024-06-01 LAB — RPR: RPR Ser Ql: NONREACTIVE

## 2024-06-01 LAB — BIRTH TISSUE RECOVERY COLLECTION (PLACENTA DONATION)

## 2024-06-01 MED ORDER — MEDROXYPROGESTERONE ACETATE 150 MG/ML IM SUSP: 150.0000 mg | INTRAMUSCULAR | Status: DC | PRN

## 2024-06-01 MED ORDER — SIMETHICONE 80 MG PO CHEW: 80.0000 mg | CHEWABLE_TABLET | ORAL | Status: DC | PRN

## 2024-06-01 MED ORDER — ONDANSETRON HCL 4 MG/2ML IJ SOLN: 4.0000 mg | INTRAMUSCULAR | Status: DC | PRN

## 2024-06-01 MED ORDER — SODIUM CHLORIDE 0.9% FLUSH: 3.0000 mL | INTRAVENOUS | Status: DC | PRN

## 2024-06-01 MED ORDER — COCONUT OIL OIL: 1.0000 | TOPICAL_OIL | Status: DC | PRN

## 2024-06-01 MED ORDER — BENZOCAINE-MENTHOL 20-0.5 % EX AERO
1.0000 | INHALATION_SPRAY | CUTANEOUS | Status: DC | PRN
  Administered 2024-06-01: 1 via TOPICAL
  Filled 2024-06-01: qty 56

## 2024-06-01 MED ORDER — TETANUS-DIPHTH-ACELL PERTUSSIS 5-2.5-18.5 LF-MCG/0.5 IM SUSY: 0.5000 mL | PREFILLED_SYRINGE | Freq: Once | INTRAMUSCULAR | Status: DC

## 2024-06-01 MED ORDER — DIBUCAINE (PERIANAL) 1 % EX OINT: 1.0000 | TOPICAL_OINTMENT | CUTANEOUS | Status: DC | PRN

## 2024-06-01 MED ORDER — SODIUM CHLORIDE 0.9 % IV SOLN
250.0000 mL | INTRAVENOUS | Status: DC | PRN
Start: 2024-06-01 — End: 2024-06-02

## 2024-06-01 MED ORDER — SENNOSIDES-DOCUSATE SODIUM 8.6-50 MG PO TABS
2.0000 | ORAL_TABLET | ORAL | Status: DC
  Administered 2024-06-01 – 2024-06-02 (×2): 2 via ORAL
  Filled 2024-06-01 (×2): qty 2

## 2024-06-01 MED ORDER — IBUPROFEN 800 MG PO TABS
800.0000 mg | ORAL_TABLET | Freq: Three times a day (TID) | ORAL | Status: DC
  Administered 2024-06-01 – 2024-06-02 (×6): 800 mg via ORAL
  Filled 2024-06-01 (×8): qty 1

## 2024-06-01 MED ORDER — SODIUM CHLORIDE 0.9% FLUSH
3.0000 mL | Freq: Two times a day (BID) | INTRAVENOUS | Status: DC
  Administered 2024-06-01: 3 mL via INTRAVENOUS

## 2024-06-01 MED ORDER — PRENATAL MULTIVITAMIN CH
1.0000 | ORAL_TABLET | Freq: Every day | ORAL | Status: DC
  Administered 2024-06-01 – 2024-06-02 (×2): 1 via ORAL
  Filled 2024-06-01 (×2): qty 1

## 2024-06-01 MED ORDER — WITCH HAZEL-GLYCERIN EX PADS
1.0000 | MEDICATED_PAD | CUTANEOUS | Status: DC | PRN
Start: 2024-06-01 — End: 2024-06-02

## 2024-06-01 MED ORDER — DIPHENHYDRAMINE HCL 25 MG PO CAPS: 25.0000 mg | ORAL_CAPSULE | Freq: Four times a day (QID) | ORAL | Status: DC | PRN

## 2024-06-01 MED ORDER — ONDANSETRON HCL 4 MG PO TABS: 4.0000 mg | ORAL_TABLET | ORAL | Status: DC | PRN

## 2024-06-01 MED ORDER — ACETAMINOPHEN 325 MG PO TABS
650.0000 mg | ORAL_TABLET | ORAL | Status: DC | PRN
  Administered 2024-06-01 – 2024-06-02 (×2): 650 mg via ORAL
  Filled 2024-06-01 (×2): qty 2

## 2024-06-01 NOTE — Lactation Note (Signed)
 This note was copied from a baby's chart. Lactation Consultation Note  Patient Name: Rhonda Waters Unijb'd Date: 06/01/2024 Age:39 hours Reason for consult: Initial assessment;Early term 70-38.6wks Mom's 7 yr old daughter interpret for her. Experienced BF mom stated BF going well having no difficulty and no concerns. Mom has no questions and doesn't need any assistance BF. Reviewed supplementing and BF. Mom encouraged to feed baby 8-12 times/24 hours and with feeding cues.  Encouraged BF before giving formula. Mom stated she is doing that. Newborn feeding habits, STS, I&O reviewed. Encouraged mom to call for assistance as needed. Mom stated she was good. Maternal Data Does the patient have breastfeeding experience prior to this delivery?: Yes How long did the patient breastfeed?: 1st child BF for 3 yrs, 2nd child BF for 2 yr, 3rd child wouldn't BF, 4th child BF for 1 yr. her youngest is now 54 yrs old.  Feeding Nipple Type: Slow - flow  LATCH Score Latch:  (LC didn't see latch.)           Hold (Positioning): No assistance needed to correctly position infant at breast.      Lactation Tools Discussed/Used    Interventions Interventions: Breast feeding basics reviewed;Education;LC Services brochure  Discharge    Consult Status Consult Status: Complete    Adolphus Hanf G 06/01/2024, 4:46 AM

## 2024-06-02 MED ORDER — IBUPROFEN 800 MG PO TABS: 800.0000 mg | ORAL_TABLET | Freq: Three times a day (TID) | ORAL | 0 refills | Status: AC

## 2024-06-02 NOTE — Progress Notes (Signed)
 Discharge instructions given via #[id number]. Patient verbalized understanding and has no further questions.Tennie Suzen BRAVO, RN

## 2024-06-05 ENCOUNTER — Encounter: Payer: Self-pay | Admitting: Obstetrics and Gynecology

## 2024-06-12 ENCOUNTER — Telehealth (HOSPITAL_COMMUNITY): Payer: Self-pay

## 2024-06-12 NOTE — Telephone Encounter (Signed)
 06/12/2024 1421  Name: Rhonda Waters MRN: [id number] DOB: Aug 27, 1986  Reason for Call:  Transition of Care Hospital Discharge Call  Contact Status: Patient Contact Status: Message  Language assistant needed: Interpreter Mode: Telephonic Interpreter Interpreter Name: [phone] Interpreter Phone Number - If applicable: [phone]        Follow-Up Questions:    Edinburgh Postnatal Depression Scale:  In the Past 7 Days:    PHQ2-9 Depression Scale:     Discharge Follow-up:    Post-discharge interventions: NA  Signature  Rosaline Deretha PEAK

## 2024-07-12 ENCOUNTER — Ambulatory Visit: Payer: Self-pay | Admitting: Obstetrics and Gynecology

## 2024-12-20 ENCOUNTER — Ambulatory Visit: Payer: Self-pay | Admitting: Family Medicine
